# Patient Record
Sex: Male | Born: 1960 | Race: White | Hispanic: No | Marital: Married | Smoking: Current every day smoker
Health system: Southern US, Community
[De-identification: ages and names within clinical notes are randomized; demographics above are authoritative.]

## PROBLEM LIST (undated history)

## (undated) DIAGNOSIS — J939 Pneumothorax, unspecified: Secondary | ICD-10-CM

## (undated) DIAGNOSIS — N209 Urinary calculus, unspecified: Secondary | ICD-10-CM

## (undated) DIAGNOSIS — S42001A Fracture of unspecified part of right clavicle, initial encounter for closed fracture: Secondary | ICD-10-CM

## (undated) DIAGNOSIS — Z8701 Personal history of pneumonia (recurrent): Secondary | ICD-10-CM

---

## 2010-03-11 HISTORY — PX: CHEST TUBE INSERTION: SHX231

## 2020-01-06 ENCOUNTER — Emergency Department (HOSPITAL_COMMUNITY): Payer: Medicaid - Out of State

## 2020-01-06 ENCOUNTER — Observation Stay (HOSPITAL_COMMUNITY)
Admission: EM | Admit: 2020-01-06 | Discharge: 2020-01-09 | DRG: 194 | Disposition: A | Discharge status: Home / Self Care | Payer: Medicaid - Out of State | Source: Ambulatory Visit | Attending: Internal Medicine | Admitting: Internal Medicine

## 2020-01-06 ENCOUNTER — Other Ambulatory Visit: Payer: Self-pay

## 2020-01-06 ENCOUNTER — Encounter (HOSPITAL_COMMUNITY): Payer: Self-pay | Admitting: Emergency Medicine

## 2020-01-06 DIAGNOSIS — F17210 Nicotine dependence, cigarettes, uncomplicated: Secondary | ICD-10-CM | POA: Diagnosis present

## 2020-01-06 DIAGNOSIS — Z6823 Body mass index (BMI) 23.0-23.9, adult: Secondary | ICD-10-CM | POA: Diagnosis not present

## 2020-01-06 DIAGNOSIS — J189 Pneumonia, unspecified organism: Principal | ICD-10-CM | POA: Diagnosis present

## 2020-01-06 DIAGNOSIS — Z801 Family history of malignant neoplasm of trachea, bronchus and lung: Secondary | ICD-10-CM | POA: Diagnosis not present

## 2020-01-06 DIAGNOSIS — D649 Anemia, unspecified: Secondary | ICD-10-CM | POA: Diagnosis present

## 2020-01-06 DIAGNOSIS — D75839 Thrombocytosis, unspecified: Secondary | ICD-10-CM | POA: Diagnosis present

## 2020-01-06 DIAGNOSIS — R042 Hemoptysis: Secondary | ICD-10-CM | POA: Diagnosis not present

## 2020-01-06 DIAGNOSIS — Z20822 Contact with and (suspected) exposure to covid-19: Secondary | ICD-10-CM | POA: Diagnosis present

## 2020-01-06 DIAGNOSIS — D509 Iron deficiency anemia, unspecified: Secondary | ICD-10-CM | POA: Diagnosis not present

## 2020-01-06 DIAGNOSIS — J440 Chronic obstructive pulmonary disease with acute lower respiratory infection: Secondary | ICD-10-CM | POA: Diagnosis not present

## 2020-01-06 DIAGNOSIS — Z87442 Personal history of urinary calculi: Secondary | ICD-10-CM | POA: Diagnosis not present

## 2020-01-06 DIAGNOSIS — E871 Hypo-osmolality and hyponatremia: Secondary | ICD-10-CM | POA: Diagnosis not present

## 2020-01-06 DIAGNOSIS — E46 Unspecified protein-calorie malnutrition: Secondary | ICD-10-CM | POA: Diagnosis present

## 2020-01-06 HISTORY — DX: Fracture of unspecified part of right clavicle, initial encounter for closed fracture: S42.001A

## 2020-01-06 HISTORY — DX: Personal history of pneumonia (recurrent): Z87.01

## 2020-01-06 HISTORY — DX: Urinary calculus, unspecified: N20.9

## 2020-01-06 HISTORY — DX: Pneumothorax, unspecified: J93.9

## 2020-01-06 LAB — CBC WITH DIFFERENTIAL/PLATELET
Abs Immature Granulocytes: 0.08 10*3/uL — ABNORMAL HIGH (ref 0.00–0.07)
Basophils Absolute: 0.1 10*3/uL (ref 0.0–0.1)
Basophils Relative: 0 %
Eosinophils Absolute: 0.1 10*3/uL (ref 0.0–0.5)
Eosinophils Relative: 1 %
HCT: 35.9 % — ABNORMAL LOW (ref 39.0–52.0)
Hemoglobin: 11.6 g/dL — ABNORMAL LOW (ref 13.0–17.0)
Immature Granulocytes: 1 %
Lymphocytes Relative: 16 %
Lymphs Abs: 2.5 10*3/uL (ref 0.7–4.0)
MCH: 30.3 pg (ref 26.0–34.0)
MCHC: 32.3 g/dL (ref 30.0–36.0)
MCV: 93.7 fL (ref 80.0–100.0)
Monocytes Absolute: 1.4 10*3/uL — ABNORMAL HIGH (ref 0.1–1.0)
Monocytes Relative: 9 %
Neutro Abs: 11.4 10*3/uL — ABNORMAL HIGH (ref 1.7–7.7)
Neutrophils Relative %: 73 %
Platelets: 678 10*3/uL — ABNORMAL HIGH (ref 150–400)
RBC: 3.83 MIL/uL — ABNORMAL LOW (ref 4.22–5.81)
RDW: 12.2 % (ref 11.5–15.5)
WBC: 15.6 10*3/uL — ABNORMAL HIGH (ref 4.0–10.5)
nRBC: 0 % (ref 0.0–0.2)

## 2020-01-06 LAB — COMPREHENSIVE METABOLIC PANEL
ALT: 28 U/L (ref 0–44)
AST: 20 U/L (ref 15–41)
Albumin: 2.8 g/dL — ABNORMAL LOW (ref 3.5–5.0)
Alkaline Phosphatase: 157 U/L — ABNORMAL HIGH (ref 38–126)
Anion gap: 10 (ref 5–15)
BUN: 14 mg/dL (ref 8–23)
CO2: 25 mmol/L (ref 22–32)
Calcium: 8.6 mg/dL — ABNORMAL LOW (ref 8.9–10.3)
Chloride: 97 mmol/L — ABNORMAL LOW (ref 98–111)
Creatinine, Ser: 0.8 mg/dL (ref 0.61–1.24)
GFR, Estimated: >60 mL/min (ref >60)
Glucose, Bld: 124 mg/dL — ABNORMAL HIGH (ref 70–99)
Potassium: 3.8 mmol/L (ref 3.5–5.1)
Sodium: 132 mmol/L — ABNORMAL LOW (ref 135–145)
Total Bilirubin: 0.6 mg/dL (ref 0.3–1.2)
Total Protein: 7.7 g/dL (ref 6.5–8.1)

## 2020-01-06 LAB — RESPIRATORY PANEL BY RT PCR (FLU A&B, COVID)
Influenza A by PCR: NEGATIVE
Influenza B by PCR: NEGATIVE
SARS Coronavirus 2 by RT PCR: NEGATIVE

## 2020-01-06 MED ORDER — IOHEXOL 350 MG/ML SOLN
100.0000 mL | Freq: Once | INTRAVENOUS | Status: AC | PRN
  Administered 2020-01-06: 100 mL via INTRAVENOUS

## 2020-01-06 NOTE — ED Triage Notes (Signed)
Pt was [facility] from [facility] here for work up. C/o of cough, sob, weakness and hemoptysis

## 2020-01-06 NOTE — ED Provider Notes (Signed)
[facility] EMERGENCY DEPARTMENT Provider Note   CSN: [id number] Arrival date & time: 01/06/20  1714     History Chief Complaint  Patient presents with   Shortness of Breath    Francisco Pittman is a 59 y.o. male.  Presents to the emergency department for evaluation of cough and shortness of breath.  He was sent from urgent care.  Patient reports that he has been having symptoms for 2 weeks.  Patient reports severe cough.  He has had generalized weakness.  He has been running a fever on and off.  He does report that he has coughed up some bloody sputum at times.  He reports that he has had frequent recurrent pneumonias in the past.  He has not had Covid vaccination.        History reviewed. No pertinent past medical history.  There are no problems to display for this patient.   History reviewed. No pertinent surgical history.     History reviewed. No pertinent family history.  Social History   Tobacco Use   Smoking status: Current Every Day Smoker   Smokeless tobacco: Never Used  Substance Use Topics   Alcohol use: Never   Drug use: Never    Home Medications Prior to Admission medications   Not on File    Allergies    Patient has no allergy information on record.  Review of Systems   Review of Systems  Constitutional: Positive for chills, fatigue and fever.  Respiratory: Positive for cough and shortness of breath.   All other systems reviewed and are negative.   Physical Exam Updated Vital Signs BP (!) 124/56 (BP Location: Left Arm)    Pulse 86    Temp 99.3 F (37.4 C) (Oral)    Resp 17    Ht 5\' 7"  (1.702 m)    Wt 69.4 kg    SpO2 98%    BMI 23.96 kg/m   Physical Exam Vitals and nursing note reviewed.  Constitutional:      General: He is not in acute distress.    Appearance: Normal appearance. He is well-developed.  HENT:     Head: Normocephalic and atraumatic.     Right Ear: Hearing normal.     Left Ear: Hearing normal.     Nose: Nose  normal.  Eyes:     Conjunctiva/sclera: Conjunctivae normal.     Pupils: Pupils are equal, round, and reactive to light.  Cardiovascular:     Rate and Rhythm: Regular rhythm.     Heart sounds: S1 normal and S2 normal. No murmur heard.  No friction rub. No gallop.   Pulmonary:     Effort: Pulmonary effort is normal. No respiratory distress.     Breath sounds: Normal breath sounds.  Chest:     Chest wall: No tenderness.  Abdominal:     General: Bowel sounds are normal.     Palpations: Abdomen is soft.     Tenderness: There is no abdominal tenderness. There is no guarding or rebound. Negative signs include Murphy's sign and McBurney's sign.     Hernia: No hernia is present.  Musculoskeletal:        General: Normal range of motion.     Cervical back: Normal range of motion and neck supple.  Skin:    General: Skin is warm and dry.     Findings: No rash.  Neurological:     Mental Status: He is alert and oriented to person, place, and time.  GCS: GCS eye subscore is 4. GCS verbal subscore is 5. GCS motor subscore is 6.     Cranial Nerves: No cranial nerve deficit.     Sensory: No sensory deficit.     Coordination: Coordination normal.  Psychiatric:        Speech: Speech normal.        Behavior: Behavior normal.        Thought Content: Thought content normal.     ED Results / Procedures / Treatments   Labs (all labs ordered are listed, but only abnormal results are displayed) Labs Reviewed  CBC WITH DIFFERENTIAL/PLATELET - Abnormal; Notable for the following components:      Result Value   WBC 15.6 (*)    RBC 3.83 (*)    Hemoglobin 11.6 (*)    HCT 35.9 (*)    Platelets 678 (*)    Neutro Abs 11.4 (*)    Monocytes Absolute 1.4 (*)    Abs Immature Granulocytes 0.08 (*)    All other components within normal limits  COMPREHENSIVE METABOLIC PANEL - Abnormal; Notable for the following components:   Sodium 132 (*)    Chloride 97 (*)    Glucose, Bld 124 (*)    Calcium 8.6 (*)     Albumin 2.8 (*)    Alkaline Phosphatase 157 (*)    All other components within normal limits  RESPIRATORY PANEL BY RT PCR (FLU A&B, COVID)  CULTURE, BLOOD (ROUTINE X 2)  CULTURE, BLOOD (ROUTINE X 2)    EKG EKG Interpretation  Date/Time:  Thursday January 06 2020 17:30:36 EDT Ventricular Rate:  79 PR Interval:  202 QRS Duration: 92 QT Interval:  354 QTC Calculation: 405 R Axis:   78 Text Interpretation: Normal sinus rhythm Normal ECG Confirmed by Gilda Crease [phone]) on 01/06/2020 5:49:42 PM   Radiology DG Chest 2 View  Result Date: 01/06/2020 CLINICAL DATA:  Cough, short of breath weakness EXAM: CHEST - 2 VIEW COMPARISON:  None. FINDINGS: Normal cardiac silhouette. 5 cm mass in the superior aspect of the LEFT lower lobe. Potential cavitary focus along the superior margin of the mass. Ovoid mass in the RIGHT upper lobe measuring 4 cm. No pleural fluid. No pneumothorax. IMPRESSION: Bilateral pulmonary masses. Favor bilateral pneumonia. Potential cavitary component of the LEFT lung mass. Recommend CT of the thorax with contrast to exclude underlying neoplasm. Electronically Signed   By: Genevive Bi M.D.   On: 01/06/2020 18:52   CT ANGIO CHEST PE W OR WO CONTRAST  Result Date: 01/06/2020 CLINICAL DATA:  Cough shortness of breath weakness hemoptysis EXAM: CT ANGIOGRAPHY CHEST WITH CONTRAST TECHNIQUE: Multidetector CT imaging of the chest was performed using the standard protocol during bolus administration of intravenous contrast. Multiplanar CT image reconstructions and MIPs were obtained to evaluate the vascular anatomy. CONTRAST:  OMNIPAQUE IOHEXOL 350 MG/ML SOLN COMPARISON:  None. FINDINGS: Cardiovascular: There is a optimal opacification of the pulmonary arteries. There is no central,segmental, or subsegmental filling defects within the pulmonary arteries. There is mild cardiomegaly. No pericardial effusion or thickening. No evidence right heart strain. There is  normal three-vessel brachiocephalic anatomy without proximal stenosis. The thoracic aorta is normal in appearance. Mediastinum/Nodes: No hilar, mediastinal, or axillary adenopathy. Thyroid gland, trachea, and esophagus demonstrate no significant findings. Lungs/Pleura: There is multifocal rounded ill-defined areas of airspace consolidation seen throughout both lungs. The largest within the posterior left lung base and periphery of the right upper lung have central areas of cystic lucency/necrosis. No pleural  effusion or pneumothorax is seen. Upper Abdomen: No acute abnormalities present in the visualized portions of the upper abdomen. Musculoskeletal: No chest wall abnormality. No acute or significant osseous findings. Review of the MIP images confirms the above findings. IMPRESSION: No central, segmental, or subsegmental pulmonary embolism. Multifocal rounded airspace consolidations seen throughout both lungs some with central areas of central lucency/necrosis. This could be due to septic emboli or multifocal pneumonia. Followup PA and lateral chest X-ray is recommended in 3-4 weeks following trial of antibiotic therapy to ensure resolution and exclude underlying malignancy. Electronically Signed   By: Jonna Clark M.D.   On: 01/06/2020 22:29    Procedures Procedures (including critical care time)  Medications Ordered in ED Medications  iohexol (OMNIPAQUE) 350 MG/ML injection 100 mL (100 mLs Intravenous Contrast Given 01/06/20 2214)    ED Course  I have reviewed the triage vital signs and the nursing notes.  Pertinent labs & imaging results that were available during my care of the patient were reviewed by me and considered in my medical decision making (see chart for details).    MDM Rules/Calculators/A&P                          Patient presents to the emergency department from urgent care.  Patient reports he has had a 2-week history of cough and chest congestion.  Cough has progressively  worsened.  He is having some pain in the chest now.  He has had fever over this period of time.  X-ray shows multifocal pneumonia with some cavitary lesions, CT scan recommended.  CT scan shows no evidence of PE, confirms multifocal pneumonia.  Imaging concerning for possible septic emboli.  Will require hospitalization for further work-up.  Final Clinical Impression(s) / ED Diagnoses Final diagnoses:  Multifocal pneumonia    Rx / DC Orders ED Discharge Orders    None       Gilda Crease, MD 01/06/20 2336

## 2020-01-07 ENCOUNTER — Encounter (HOSPITAL_COMMUNITY): Payer: Self-pay | Admitting: Internal Medicine

## 2020-01-07 DIAGNOSIS — E46 Unspecified protein-calorie malnutrition: Secondary | ICD-10-CM | POA: Diagnosis present

## 2020-01-07 DIAGNOSIS — D75839 Thrombocytosis, unspecified: Secondary | ICD-10-CM | POA: Diagnosis present

## 2020-01-07 DIAGNOSIS — D649 Anemia, unspecified: Secondary | ICD-10-CM | POA: Diagnosis present

## 2020-01-07 DIAGNOSIS — J189 Pneumonia, unspecified organism: Secondary | ICD-10-CM | POA: Diagnosis not present

## 2020-01-07 DIAGNOSIS — E871 Hypo-osmolality and hyponatremia: Secondary | ICD-10-CM | POA: Diagnosis present

## 2020-01-07 LAB — HIV ANTIBODY (ROUTINE TESTING W REFLEX): HIV Screen 4th Generation wRfx: NONREACTIVE

## 2020-01-07 LAB — BASIC METABOLIC PANEL
Anion gap: 9 (ref 5–15)
BUN: 13 mg/dL (ref 8–23)
CO2: 23 mmol/L (ref 22–32)
Calcium: 8.6 mg/dL — ABNORMAL LOW (ref 8.9–10.3)
Chloride: 101 mmol/L (ref 98–111)
Creatinine, Ser: 0.63 mg/dL (ref 0.61–1.24)
GFR, Estimated: >60 mL/min (ref >60)
Glucose, Bld: 130 mg/dL — ABNORMAL HIGH (ref 70–99)
Potassium: 4.4 mmol/L (ref 3.5–5.1)
Sodium: 133 mmol/L — ABNORMAL LOW (ref 135–145)

## 2020-01-07 LAB — CBC WITH DIFFERENTIAL/PLATELET
Abs Immature Granulocytes: 0.06 10*3/uL (ref 0.00–0.07)
Basophils Absolute: 0 10*3/uL (ref 0.0–0.1)
Basophils Relative: 0 %
Eosinophils Absolute: 0 10*3/uL (ref 0.0–0.5)
Eosinophils Relative: 0 %
HCT: 33.4 % — ABNORMAL LOW (ref 39.0–52.0)
Hemoglobin: 10.9 g/dL — ABNORMAL LOW (ref 13.0–17.0)
Immature Granulocytes: 1 %
Lymphocytes Relative: 7 %
Lymphs Abs: 0.9 10*3/uL (ref 0.7–4.0)
MCH: 30.4 pg (ref 26.0–34.0)
MCHC: 32.6 g/dL (ref 30.0–36.0)
MCV: 93.3 fL (ref 80.0–100.0)
Monocytes Absolute: 0.3 10*3/uL (ref 0.1–1.0)
Monocytes Relative: 2 %
Neutro Abs: 12.1 10*3/uL — ABNORMAL HIGH (ref 1.7–7.7)
Neutrophils Relative %: 90 %
Platelets: 592 10*3/uL — ABNORMAL HIGH (ref 150–400)
RBC: 3.58 MIL/uL — ABNORMAL LOW (ref 4.22–5.81)
RDW: 12.2 % (ref 11.5–15.5)
WBC: 13.3 10*3/uL — ABNORMAL HIGH (ref 4.0–10.5)
nRBC: 0 % (ref 0.0–0.2)

## 2020-01-07 LAB — RETICULOCYTES
Immature Retic Fract: 9.2 % (ref 2.3–15.9)
RBC.: 3.62 MIL/uL — ABNORMAL LOW (ref 4.22–5.81)
Retic Count, Absolute: 37.6 10*3/uL (ref 19.0–186.0)
Retic Ct Pct: 1 % (ref 0.4–3.1)

## 2020-01-07 LAB — IRON AND TIBC
Iron: 24 ug/dL — ABNORMAL LOW (ref 45–182)
Saturation Ratios: 12 % — ABNORMAL LOW (ref 17.9–39.5)
TIBC: 204 ug/dL — ABNORMAL LOW (ref 250–450)
UIBC: 180 ug/dL

## 2020-01-07 LAB — MRSA PCR SCREENING: MRSA by PCR: NEGATIVE

## 2020-01-07 LAB — FOLATE: Folate: 11.3 ng/mL (ref >5.9)

## 2020-01-07 LAB — FERRITIN: Ferritin: 726 ng/mL — ABNORMAL HIGH (ref 24–336)

## 2020-01-07 LAB — VITAMIN B12: Vitamin B-12: 262 pg/mL (ref 180–914)

## 2020-01-07 MED ORDER — SODIUM CHLORIDE 0.9 % IV SOLN
2.0000 g | Freq: Once | INTRAVENOUS | Status: AC
  Administered 2020-01-07: 2 g via INTRAVENOUS
  Filled 2020-01-07: qty 2

## 2020-01-07 MED ORDER — VANCOMYCIN HCL 750 MG/150ML IV SOLN
750.0000 mg | Freq: Three times a day (TID) | INTRAVENOUS | Status: DC
  Administered 2020-01-07 – 2020-01-08 (×4): 750 mg via INTRAVENOUS
  Filled 2020-01-07 (×12): qty 150

## 2020-01-07 MED ORDER — DM-GUAIFENESIN ER 30-600 MG PO TB12
1.0000 | ORAL_TABLET | Freq: Two times a day (BID) | ORAL | Status: DC
  Administered 2020-01-07 – 2020-01-09 (×6): 1 via ORAL
  Filled 2020-01-07 (×6): qty 1

## 2020-01-07 MED ORDER — IPRATROPIUM-ALBUTEROL 0.5-2.5 (3) MG/3ML IN SOLN
3.0000 mL | Freq: Four times a day (QID) | RESPIRATORY_TRACT | Status: DC | PRN
  Administered 2020-01-07: 3 mL via RESPIRATORY_TRACT
  Filled 2020-01-07: qty 3

## 2020-01-07 MED ORDER — ONDANSETRON HCL 4 MG PO TABS: 4.0000 mg | ORAL_TABLET | Freq: Four times a day (QID) | ORAL | Status: DC | PRN

## 2020-01-07 MED ORDER — ONDANSETRON HCL 4 MG/2ML IJ SOLN: 4.0000 mg | Freq: Four times a day (QID) | INTRAMUSCULAR | Status: DC | PRN

## 2020-01-07 MED ORDER — ACETAMINOPHEN 325 MG PO TABS: 650.0000 mg | ORAL_TABLET | Freq: Four times a day (QID) | ORAL | Status: DC | PRN

## 2020-01-07 MED ORDER — ACETAMINOPHEN 650 MG RE SUPP: 650.0000 mg | Freq: Four times a day (QID) | RECTAL | Status: DC | PRN

## 2020-01-07 MED ORDER — SODIUM CHLORIDE 0.9 % IV SOLN: INTRAVENOUS | Status: DC

## 2020-01-07 MED ORDER — METHYLPREDNISOLONE SODIUM SUCC 40 MG IJ SOLR
40.0000 mg | Freq: Once | INTRAMUSCULAR | Status: AC
  Administered 2020-01-07: 40 mg via INTRAVENOUS
  Filled 2020-01-07: qty 1

## 2020-01-07 MED ORDER — VANCOMYCIN HCL 1250 MG/250ML IV SOLN
1250.0000 mg | Freq: Once | INTRAVENOUS | Status: AC
  Administered 2020-01-07: 1250 mg via INTRAVENOUS
  Filled 2020-01-07: qty 250

## 2020-01-07 MED ORDER — SODIUM CHLORIDE 0.9 % IV SOLN
2.0000 g | Freq: Three times a day (TID) | INTRAVENOUS | Status: DC
  Administered 2020-01-07 – 2020-01-09 (×7): 2 g via INTRAVENOUS
  Filled 2020-01-07 (×7): qty 2
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
## 2020-01-07 NOTE — Progress Notes (Signed)
Pharmacy Antibiotic Note  Francisco Pittman is a 59 y.o. male admitted on 01/06/2020 with pneumonia.  Pharmacy has been consulted for vancomycin and cefepim dosing.  Plan: Vancomycin 1250mg  x1 then 750mg  IV every 8 hours.  Goal trough 15-20 mcg/mL.  Cefepime 2g IV every 8 hours.  Height: 5\' 7"  (170.2 cm) Weight: 69.4 kg (153 lb) IBW/kg (Calculated) : 66.1  Temp (24hrs), Avg:99.3 F (37.4 C), Min:99.3 F (37.4 C), Max:99.3 F (37.4 C)  Recent Labs  Lab 01/06/20 1809  WBC 15.6*  CREATININE 0.80    Estimated Creatinine Clearance: 90.7 mL/min (by C-G formula based on SCr of 0.8 mg/dL).     Thank you for allowing pharmacy to be a part of this patient's care.  , PharmD, BCPS  01/07/2020 1:37 AM

## 2020-01-08 DIAGNOSIS — D75839 Thrombocytosis, unspecified: Secondary | ICD-10-CM | POA: Diagnosis not present

## 2020-01-08 DIAGNOSIS — E871 Hypo-osmolality and hyponatremia: Secondary | ICD-10-CM | POA: Diagnosis not present

## 2020-01-08 DIAGNOSIS — J189 Pneumonia, unspecified organism: Secondary | ICD-10-CM | POA: Diagnosis not present

## 2020-01-08 DIAGNOSIS — D649 Anemia, unspecified: Secondary | ICD-10-CM

## 2020-01-08 LAB — BASIC METABOLIC PANEL
Anion gap: 8 (ref 5–15)
BUN: 15 mg/dL (ref 8–23)
CO2: 22 mmol/L (ref 22–32)
Calcium: 8.4 mg/dL — ABNORMAL LOW (ref 8.9–10.3)
Chloride: 105 mmol/L (ref 98–111)
Creatinine, Ser: 0.6 mg/dL — ABNORMAL LOW (ref 0.61–1.24)
GFR, Estimated: >60 mL/min (ref >60)
Glucose, Bld: 100 mg/dL — ABNORMAL HIGH (ref 70–99)
Potassium: 4.3 mmol/L (ref 3.5–5.1)
Sodium: 135 mmol/L (ref 135–145)

## 2020-01-08 LAB — CBC WITH DIFFERENTIAL/PLATELET
Abs Immature Granulocytes: 0.04 10*3/uL (ref 0.00–0.07)
Basophils Absolute: 0 10*3/uL (ref 0.0–0.1)
Basophils Relative: 0 %
Eosinophils Absolute: 0 10*3/uL (ref 0.0–0.5)
Eosinophils Relative: 0 %
HCT: 33.9 % — ABNORMAL LOW (ref 39.0–52.0)
Hemoglobin: 10.9 g/dL — ABNORMAL LOW (ref 13.0–17.0)
Immature Granulocytes: 0 %
Lymphocytes Relative: 23 %
Lymphs Abs: 2.8 10*3/uL (ref 0.7–4.0)
MCH: 30.5 pg (ref 26.0–34.0)
MCHC: 32.2 g/dL (ref 30.0–36.0)
MCV: 95 fL (ref 80.0–100.0)
Monocytes Absolute: 1 10*3/uL (ref 0.1–1.0)
Monocytes Relative: 8 %
Neutro Abs: 8.4 10*3/uL — ABNORMAL HIGH (ref 1.7–7.7)
Neutrophils Relative %: 69 %
Platelets: 648 10*3/uL — ABNORMAL HIGH (ref 150–400)
RBC: 3.57 MIL/uL — ABNORMAL LOW (ref 4.22–5.81)
RDW: 12.2 % (ref 11.5–15.5)
WBC: 12.3 10*3/uL — ABNORMAL HIGH (ref 4.0–10.5)
nRBC: 0 % (ref 0.0–0.2)

## 2020-01-08 LAB — VANCOMYCIN, TROUGH: Vancomycin Tr: 15 ug/mL (ref 15–20)
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
## 2020-01-09 ENCOUNTER — Other Ambulatory Visit: Payer: Self-pay | Admitting: Internal Medicine

## 2020-01-09 DIAGNOSIS — J189 Pneumonia, unspecified organism: Secondary | ICD-10-CM | POA: Diagnosis not present

## 2020-01-09 DIAGNOSIS — E871 Hypo-osmolality and hyponatremia: Secondary | ICD-10-CM | POA: Diagnosis not present

## 2020-01-09 DIAGNOSIS — J449 Chronic obstructive pulmonary disease, unspecified: Secondary | ICD-10-CM

## 2020-01-09 DIAGNOSIS — D649 Anemia, unspecified: Secondary | ICD-10-CM | POA: Diagnosis not present

## 2020-01-09 LAB — BASIC METABOLIC PANEL
Anion gap: 7 (ref 5–15)
BUN: 11 mg/dL (ref 8–23)
CO2: 23 mmol/L (ref 22–32)
Calcium: 8.2 mg/dL — ABNORMAL LOW (ref 8.9–10.3)
Chloride: 105 mmol/L (ref 98–111)
Creatinine, Ser: 0.63 mg/dL (ref 0.61–1.24)
GFR, Estimated: >60 mL/min (ref >60)
Glucose, Bld: 132 mg/dL — ABNORMAL HIGH (ref 70–99)
Potassium: 3.5 mmol/L (ref 3.5–5.1)
Sodium: 135 mmol/L (ref 135–145)

## 2020-01-09 LAB — CBC WITH DIFFERENTIAL/PLATELET
Abs Immature Granulocytes: 0.04 10*3/uL (ref 0.00–0.07)
Basophils Absolute: 0.1 10*3/uL (ref 0.0–0.1)
Basophils Relative: 1 %
Eosinophils Absolute: 0.1 10*3/uL (ref 0.0–0.5)
Eosinophils Relative: 1 %
HCT: 31.6 % — ABNORMAL LOW (ref 39.0–52.0)
Hemoglobin: 10.2 g/dL — ABNORMAL LOW (ref 13.0–17.0)
Immature Granulocytes: 0 %
Lymphocytes Relative: 28 %
Lymphs Abs: 2.6 10*3/uL (ref 0.7–4.0)
MCH: 30.4 pg (ref 26.0–34.0)
MCHC: 32.3 g/dL (ref 30.0–36.0)
MCV: 94.3 fL (ref 80.0–100.0)
Monocytes Absolute: 0.8 10*3/uL (ref 0.1–1.0)
Monocytes Relative: 8 %
Neutro Abs: 5.8 10*3/uL (ref 1.7–7.7)
Neutrophils Relative %: 62 %
Platelets: 597 10*3/uL — ABNORMAL HIGH (ref 150–400)
RBC: 3.35 MIL/uL — ABNORMAL LOW (ref 4.22–5.81)
RDW: 12.3 % (ref 11.5–15.5)
WBC: 9.4 10*3/uL (ref 4.0–10.5)
nRBC: 0 % (ref 0.0–0.2)

## 2020-01-09 MED ORDER — NICOTINE 21 MG/24HR TD PT24: 21.0000 mg | MEDICATED_PATCH | TRANSDERMAL | 1 refills | Status: DC

## 2020-01-09 MED ORDER — ALBUTEROL SULFATE HFA 108 (90 BASE) MCG/ACT IN AERS: 2.0000 | INHALATION_SPRAY | Freq: Four times a day (QID) | RESPIRATORY_TRACT | 2 refills | Status: AC | PRN

## 2020-01-09 MED ORDER — GUAIFENESIN ER 600 MG PO TB12: 600.0000 mg | ORAL_TABLET | Freq: Two times a day (BID) | ORAL | 2 refills | Status: DC

## 2020-01-09 MED ORDER — AMOXICILLIN-POT CLAVULANATE 875-125 MG PO TABS: 1.0000 | ORAL_TABLET | Freq: Two times a day (BID) | ORAL | 0 refills | Status: AC

## 2020-01-09 NOTE — Plan of Care (Signed)
  Problem: Education: Goal: Knowledge of General Education information will improve Description Including pain rating scale, medication(s)/side effects and non-pharmacologic comfort measures Outcome: Progressing   Problem: Health Behavior/Discharge Planning: Goal: Ability to manage health-related needs will improve Outcome: Progressing   
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
## 2020-01-09 NOTE — Discharge Instructions (Signed)
Community-Acquired Pneumonia, Adult Pneumonia is an infection of the lungs. It causes swelling in the airways of the lungs. Mucus and fluid may also build up inside the airways. One type of pneumonia can happen while a person is in a hospital. A different type can happen when a person is not in a hospital (community-acquired pneumonia).  What are the causes?  This condition is caused by germs (viruses, bacteria, or fungi). Some types of germs can be passed from one person to another. This can happen when you breathe in droplets from the cough or sneeze of an infected person. What increases the risk? You are more likely to develop this condition if you:  Have a long-term (chronic) disease, such as: ? Chronic obstructive pulmonary disease (COPD). ? Asthma. ? Cystic fibrosis. ? Congestive heart failure. ? Diabetes. ? Kidney disease.  Have HIV.  Have sickle cell disease.  Have had your spleen removed.  Do not take good care of your teeth and mouth (poor dental hygiene).  Have a medical condition that increases the risk of breathing in droplets from your own mouth and nose.  Have a weakened body defense system (immune system).  Are a smoker.  Travel to areas where the germs that cause this illness are common.  Are around certain animals or the places they live. What are the signs or symptoms?  A dry cough.  A wet (productive) cough.  Fever.  Sweating.  Chest pain. This often happens when breathing deeply or coughing.  Fast breathing or trouble breathing.  Shortness of breath.  Shaking chills.  Feeling tired (fatigue).  Muscle aches. How is this treated? Treatment for this condition depends on many things. Most adults can be treated at home. In some cases, treatment must happen in a hospital. Treatment may include:  Medicines given by mouth or through an IV tube.  Being given extra oxygen.  Respiratory therapy. In rare cases, treatment for very bad pneumonia  may include:  Using a machine to help you breathe.  Having a procedure to remove fluid from around your lungs. Follow these instructions at home: Medicines  Take over-the-counter and prescription medicines only as told by your doctor. ? Only take cough medicine if you are losing sleep.  If you were prescribed an antibiotic medicine, take it as told by your doctor. Do not stop taking the antibiotic even if you start to feel better. General instructions   Sleep with your head and neck raised (elevated). You can do this by sleeping in a recliner or by putting a few pillows under your head.  Rest as needed. Get at least 8 hours of sleep each night.  Drink enough water to keep your pee (urine) pale yellow.  Eat a healthy diet that includes plenty of vegetables, fruits, whole grains, low-fat dairy products, and lean protein.  Do not use any products that contain nicotine or tobacco. These include cigarettes, e-cigarettes, and chewing tobacco. If you need help quitting, ask your doctor.  Keep all follow-up visits as told by your doctor. This is important. How is this prevented? A shot (vaccine) can help prevent pneumonia. Shots are often suggested for:  People older than 61 years of age.  People older than 61 years of age who: ? Are having cancer treatment. ? Have long-term (chronic) lung disease. ? Have problems with their body's defense system. You may also prevent pneumonia if you take these actions:  Get the flu (influenza) shot every year.  Go to the dentist as   often as told.  Wash your hands often. If you cannot use soap and water, use hand sanitizer. Contact a doctor if:  You have a fever.  You lose sleep because your cough medicine does not help. Get help right away if:  You are short of breath and it gets worse.  You have more chest pain.  Your sickness gets worse. This is very serious if: ? You are an older adult. ? Your body's defense system is weak.  You  cough up blood. Summary  Pneumonia is an infection of the lungs.  Most adults can be treated at home. Some will need treatment in a hospital.  Drink enough water to keep your pee pale yellow.  Get at least 8 hours of sleep each night. This information is not intended to replace advice given to you by your health care provider. Make sure you discuss any questions you have with your health care provider. Document Revised: 06/17/2018 Document Reviewed: 10/23/2017 Elsevier Patient Education  2020 Else[employer] Inc[employer]  

## 2020-01-09 NOTE — Progress Notes (Signed)
Nsg Discharge Note  Admit Date:  01/06/2020 Discharge date: 01/09/2020   Dewane Timson to be D/C'd Home  per MD order.  AVS completed.  Patient able to verbalize understanding.  Discharge Medication: Allergies as of 01/09/2020   Not on File     Medication List    TAKE these medications   albuterol 108 (90 Base) MCG/ACT inhaler Commonly known as: VENTOLIN HFA Inhale 2 puffs into the lungs every 6 (six) hours as needed for wheezing or shortness of breath.   amoxicillin-clavulanate 875-125 MG tablet Commonly known as: Augmentin Take 1 tablet by mouth 2 (two) times daily for 7 days.   guaiFENesin 600 MG 12 hr tablet Commonly known as: Mucinex Take 1 tablet (600 mg total) by mouth 2 (two) times daily.   nicotine 21 mg/24hr patch Commonly known as: NICODERM CQ - dosed in mg/24 hours Place 1 patch (21 mg total) onto the skin daily.       Discharge Assessment: Vitals:   01/08/20 2218 01/09/20 0611  BP: 126/60 (!) 109/56  Pulse: 61 (!) 58  Resp:  18  Temp: 98.7 F (37.1 C) 97.8 F (36.6 C)  SpO2: 98% 98%   Skin clean, dry and intact without evidence of skin break down, no evidence of skin tears noted. IV catheter discontinued intact. Site without signs and symptoms of complications - no redness or edema noted at insertion site, patient denies c/o pain - only slight tenderness at site.  Dressing with slight pressure applied.  D/c Instructions-Education: Discharge instructions given to patient with verbalized understanding. D/c education completed with patient including follow up instructions, medication list, d/c activities limitations if indicated, with other d/c instructions as indicated by MD - patient able to verbalize understanding, all questions fully answered. Patient instructed to return to ED, call 911, or call MD for any changes in condition.  Patient escorted via WC, and D/C home via private auto.  Rohith Fauth Salena Saner, RN 01/09/2020 2:14 PM

## 2020-01-12 LAB — CULTURE, BLOOD (ROUTINE X 2)
Culture: NO GROWTH
Culture: NO GROWTH
Special Requests: ADEQUATE
Special Requests: ADEQUATE

## 2020-02-02 ENCOUNTER — Emergency Department (HOSPITAL_COMMUNITY): Payer: Medicaid - Out of State

## 2020-02-02 ENCOUNTER — Inpatient Hospital Stay (HOSPITAL_COMMUNITY)
Admission: EM | Admit: 2020-02-02 | Discharge: 2020-02-16 | DRG: 853 | Disposition: A | Discharge status: Home / Self Care | Payer: Medicaid - Out of State | Attending: Internal Medicine | Admitting: Internal Medicine

## 2020-02-02 ENCOUNTER — Inpatient Hospital Stay (HOSPITAL_COMMUNITY): Payer: Medicaid - Out of State

## 2020-02-02 ENCOUNTER — Other Ambulatory Visit: Payer: Self-pay

## 2020-02-02 ENCOUNTER — Encounter (HOSPITAL_COMMUNITY): Payer: Self-pay | Admitting: Emergency Medicine

## 2020-02-02 DIAGNOSIS — J189 Pneumonia, unspecified organism: Secondary | ICD-10-CM | POA: Diagnosis not present

## 2020-02-02 DIAGNOSIS — R0600 Dyspnea, unspecified: Secondary | ICD-10-CM

## 2020-02-02 DIAGNOSIS — K029 Dental caries, unspecified: Secondary | ICD-10-CM | POA: Diagnosis present

## 2020-02-02 DIAGNOSIS — K5900 Constipation, unspecified: Secondary | ICD-10-CM | POA: Diagnosis present

## 2020-02-02 DIAGNOSIS — E871 Hypo-osmolality and hyponatremia: Secondary | ICD-10-CM | POA: Diagnosis present

## 2020-02-02 DIAGNOSIS — D638 Anemia in other chronic diseases classified elsewhere: Secondary | ICD-10-CM | POA: Diagnosis present

## 2020-02-02 DIAGNOSIS — Y95 Nosocomial condition: Secondary | ICD-10-CM | POA: Diagnosis present

## 2020-02-02 DIAGNOSIS — J918 Pleural effusion in other conditions classified elsewhere: Secondary | ICD-10-CM | POA: Diagnosis present

## 2020-02-02 DIAGNOSIS — A419 Sepsis, unspecified organism: Principal | ICD-10-CM | POA: Diagnosis present

## 2020-02-02 DIAGNOSIS — J869 Pyothorax without fistula: Secondary | ICD-10-CM | POA: Diagnosis present

## 2020-02-02 DIAGNOSIS — D509 Iron deficiency anemia, unspecified: Secondary | ICD-10-CM | POA: Diagnosis present

## 2020-02-02 DIAGNOSIS — J9811 Atelectasis: Secondary | ICD-10-CM | POA: Diagnosis present

## 2020-02-02 DIAGNOSIS — J449 Chronic obstructive pulmonary disease, unspecified: Secondary | ICD-10-CM | POA: Diagnosis present

## 2020-02-02 DIAGNOSIS — Z9689 Presence of other specified functional implants: Secondary | ICD-10-CM

## 2020-02-02 DIAGNOSIS — Z720 Tobacco use: Secondary | ICD-10-CM | POA: Diagnosis present

## 2020-02-02 DIAGNOSIS — B954 Other streptococcus as the cause of diseases classified elsewhere: Secondary | ICD-10-CM | POA: Diagnosis present

## 2020-02-02 DIAGNOSIS — Z20822 Contact with and (suspected) exposure to covid-19: Secondary | ICD-10-CM | POA: Diagnosis present

## 2020-02-02 DIAGNOSIS — E46 Unspecified protein-calorie malnutrition: Secondary | ICD-10-CM | POA: Diagnosis present

## 2020-02-02 DIAGNOSIS — J90 Pleural effusion, not elsewhere classified: Secondary | ICD-10-CM

## 2020-02-02 DIAGNOSIS — Z716 Tobacco abuse counseling: Secondary | ICD-10-CM

## 2020-02-02 DIAGNOSIS — L89321 Pressure ulcer of left buttock, stage 1: Secondary | ICD-10-CM | POA: Diagnosis not present

## 2020-02-02 DIAGNOSIS — F1010 Alcohol abuse, uncomplicated: Secondary | ICD-10-CM | POA: Diagnosis not present

## 2020-02-02 DIAGNOSIS — Z8701 Personal history of pneumonia (recurrent): Secondary | ICD-10-CM | POA: Diagnosis not present

## 2020-02-02 DIAGNOSIS — Z801 Family history of malignant neoplasm of trachea, bronchus and lung: Secondary | ICD-10-CM

## 2020-02-02 DIAGNOSIS — R6520 Severe sepsis without septic shock: Secondary | ICD-10-CM

## 2020-02-02 DIAGNOSIS — E876 Hypokalemia: Secondary | ICD-10-CM | POA: Diagnosis present

## 2020-02-02 DIAGNOSIS — Z9889 Other specified postprocedural states: Secondary | ICD-10-CM | POA: Diagnosis not present

## 2020-02-02 DIAGNOSIS — Z79899 Other long term (current) drug therapy: Secondary | ICD-10-CM | POA: Diagnosis not present

## 2020-02-02 DIAGNOSIS — F17210 Nicotine dependence, cigarettes, uncomplicated: Secondary | ICD-10-CM | POA: Diagnosis present

## 2020-02-02 DIAGNOSIS — J440 Chronic obstructive pulmonary disease with acute lower respiratory infection: Secondary | ICD-10-CM | POA: Diagnosis present

## 2020-02-02 DIAGNOSIS — I371 Nonrheumatic pulmonary valve insufficiency: Secondary | ICD-10-CM | POA: Diagnosis not present

## 2020-02-02 DIAGNOSIS — R0602 Shortness of breath: Secondary | ICD-10-CM | POA: Diagnosis not present

## 2020-02-02 DIAGNOSIS — L8990 Pressure ulcer of unspecified site, unspecified stage: Secondary | ICD-10-CM | POA: Diagnosis present

## 2020-02-02 DIAGNOSIS — Z09 Encounter for follow-up examination after completed treatment for conditions other than malignant neoplasm: Secondary | ICD-10-CM

## 2020-02-02 DIAGNOSIS — D649 Anemia, unspecified: Secondary | ICD-10-CM | POA: Diagnosis present

## 2020-02-02 DIAGNOSIS — J939 Pneumothorax, unspecified: Secondary | ICD-10-CM

## 2020-02-02 DIAGNOSIS — K089 Disorder of teeth and supporting structures, unspecified: Secondary | ICD-10-CM | POA: Diagnosis not present

## 2020-02-02 DIAGNOSIS — J188 Other pneumonia, unspecified organism: Secondary | ICD-10-CM | POA: Diagnosis present

## 2020-02-02 LAB — CBC WITH DIFFERENTIAL/PLATELET
Abs Immature Granulocytes: 0.22 10*3/uL — ABNORMAL HIGH (ref 0.00–0.07)
Basophils Absolute: 0.1 10*3/uL (ref 0.0–0.1)
Basophils Relative: 0 %
Eosinophils Absolute: 0 10*3/uL (ref 0.0–0.5)
Eosinophils Relative: 0 %
HCT: 32.9 % — ABNORMAL LOW (ref 39.0–52.0)
Hemoglobin: 10.5 g/dL — ABNORMAL LOW (ref 13.0–17.0)
Immature Granulocytes: 1 %
Lymphocytes Relative: 5 %
Lymphs Abs: 1.3 10*3/uL (ref 0.7–4.0)
MCH: 29.1 pg (ref 26.0–34.0)
MCHC: 31.9 g/dL (ref 30.0–36.0)
MCV: 91.1 fL (ref 80.0–100.0)
Monocytes Absolute: 2.6 10*3/uL — ABNORMAL HIGH (ref 0.1–1.0)
Monocytes Relative: 9 %
Neutro Abs: 23.8 10*3/uL — ABNORMAL HIGH (ref 1.7–7.7)
Neutrophils Relative %: 85 %
Platelets: 500 10*3/uL — ABNORMAL HIGH (ref 150–400)
RBC: 3.61 MIL/uL — ABNORMAL LOW (ref 4.22–5.81)
RDW: 13.4 % (ref 11.5–15.5)
WBC: 28 10*3/uL — ABNORMAL HIGH (ref 4.0–10.5)
nRBC: 0 % (ref 0.0–0.2)

## 2020-02-02 LAB — URINALYSIS, ROUTINE W REFLEX MICROSCOPIC
Bilirubin Urine: NEGATIVE
Glucose, UA: NEGATIVE mg/dL
Hgb urine dipstick: NEGATIVE
Ketones, ur: 5 mg/dL — AB
Leukocytes,Ua: NEGATIVE
Nitrite: NEGATIVE
Protein, ur: 30 mg/dL — AB
Specific Gravity, Urine: 1.02 (ref 1.005–1.030)
pH: 5 (ref 5.0–8.0)

## 2020-02-02 LAB — BODY FLUID CELL COUNT WITH DIFFERENTIAL
Lymphs, Fluid: 35 %
Monocyte-Macrophage-Serous Fluid: 42 % — ABNORMAL LOW (ref 50–90)
Neutrophil Count, Fluid: 23 % (ref 0–25)
Total Nucleated Cell Count, Fluid: 49513 cu mm — ABNORMAL HIGH (ref 0–1000)

## 2020-02-02 LAB — PROTEIN, PLEURAL OR PERITONEAL FLUID: Total protein, fluid: <3 g/dL

## 2020-02-02 LAB — COMPREHENSIVE METABOLIC PANEL
ALT: 17 U/L (ref 0–44)
AST: 28 U/L (ref 15–41)
Albumin: 2.6 g/dL — ABNORMAL LOW (ref 3.5–5.0)
Alkaline Phosphatase: 140 U/L — ABNORMAL HIGH (ref 38–126)
Anion gap: 11 (ref 5–15)
BUN: 29 mg/dL — ABNORMAL HIGH (ref 8–23)
CO2: 23 mmol/L (ref 22–32)
Calcium: 8.6 mg/dL — ABNORMAL LOW (ref 8.9–10.3)
Chloride: 102 mmol/L (ref 98–111)
Creatinine, Ser: 1.11 mg/dL (ref 0.61–1.24)
GFR, Estimated: >60 mL/min (ref >60)
Glucose, Bld: 117 mg/dL — ABNORMAL HIGH (ref 70–99)
Potassium: 3.6 mmol/L (ref 3.5–5.1)
Sodium: 136 mmol/L (ref 135–145)
Total Bilirubin: 1 mg/dL (ref 0.3–1.2)
Total Protein: 7.4 g/dL (ref 6.5–8.1)

## 2020-02-02 LAB — RESP PANEL BY RT-PCR (FLU A&B, COVID) ARPGX2
Influenza A by PCR: NEGATIVE
Influenza B by PCR: NEGATIVE
SARS Coronavirus 2 by RT PCR: NEGATIVE

## 2020-02-02 LAB — GRAM STAIN

## 2020-02-02 LAB — ALBUMIN, PLEURAL OR PERITONEAL FLUID: Albumin, Fluid: 1.9 g/dL

## 2020-02-02 LAB — PROTIME-INR
INR: 1.3 — ABNORMAL HIGH (ref 0.8–1.2)
Prothrombin Time: 15.9 seconds — ABNORMAL HIGH (ref 11.4–15.2)

## 2020-02-02 LAB — GLUCOSE, PLEURAL OR PERITONEAL FLUID: Glucose, Fluid: <20 mg/dL

## 2020-02-02 LAB — AMYLASE, PLEURAL OR PERITONEAL FLUID: Amylase, Fluid: <5 U/L

## 2020-02-02 LAB — PROTEIN, TOTAL: Total Protein: 6.2 g/dL — ABNORMAL LOW (ref 6.5–8.1)

## 2020-02-02 LAB — LACTATE DEHYDROGENASE: LDH: 95 U/L — ABNORMAL LOW (ref 98–192)

## 2020-02-02 LAB — LACTATE DEHYDROGENASE, PLEURAL OR PERITONEAL FLUID: LD, Fluid: 5304 U/L — ABNORMAL HIGH (ref 3–23)

## 2020-02-02 LAB — LACTIC ACID, PLASMA
Lactic Acid, Venous: 1.2 mmol/L (ref 0.5–1.9)
Lactic Acid, Venous: 1 mmol/L (ref 0.5–1.9)

## 2020-02-02 LAB — ALBUMIN: Albumin: 2 g/dL — ABNORMAL LOW (ref 3.5–5.0)

## 2020-02-02 LAB — APTT: aPTT: 36 seconds (ref 24–36)

## 2020-02-02 MED ORDER — VANCOMYCIN HCL 1500 MG/300ML IV SOLN
1500.0000 mg | Freq: Once | INTRAVENOUS | Status: AC
  Administered 2020-02-02: 1500 mg via INTRAVENOUS
  Filled 2020-02-02: qty 300

## 2020-02-02 MED ORDER — ONDANSETRON HCL 4 MG PO TABS: 4.0000 mg | ORAL_TABLET | Freq: Four times a day (QID) | ORAL | Status: DC | PRN

## 2020-02-02 MED ORDER — ONDANSETRON HCL 4 MG/2ML IJ SOLN
4.0000 mg | Freq: Four times a day (QID) | INTRAMUSCULAR | Status: DC | PRN
  Administered 2020-02-04: 4 mg via INTRAVENOUS
  Filled 2020-02-02: qty 2

## 2020-02-02 MED ORDER — SODIUM CHLORIDE 0.9% FLUSH: 3.0000 mL | INTRAVENOUS | Status: DC | PRN

## 2020-02-02 MED ORDER — NICOTINE 14 MG/24HR TD PT24
14.0000 mg | MEDICATED_PATCH | Freq: Every day | TRANSDERMAL | Status: DC
  Administered 2020-02-02 – 2020-02-15 (×5): 14 mg via TRANSDERMAL
  Filled 2020-02-02 (×13): qty 1

## 2020-02-02 MED ORDER — SODIUM CHLORIDE 0.9 % IV SOLN: INTRAVENOUS | Status: DC

## 2020-02-02 MED ORDER — THIAMINE HCL 100 MG PO TABS
100.0000 mg | ORAL_TABLET | Freq: Every day | ORAL | Status: DC
  Administered 2020-02-02 – 2020-02-03 (×2): 100 mg via ORAL
  Filled 2020-02-02 (×2): qty 1

## 2020-02-02 MED ORDER — SODIUM CHLORIDE 0.9% FLUSH
3.0000 mL | Freq: Two times a day (BID) | INTRAVENOUS | Status: DC
  Administered 2020-02-02: 3 mL via INTRAVENOUS

## 2020-02-02 MED ORDER — INFLUENZA VAC SPLIT QUAD 0.5 ML IM SUSY: 0.5000 mL | PREFILLED_SYRINGE | INTRAMUSCULAR | Status: DC | PRN

## 2020-02-02 MED ORDER — MOMETASONE FURO-FORMOTEROL FUM 200-5 MCG/ACT IN AERO
2.0000 | INHALATION_SPRAY | Freq: Two times a day (BID) | RESPIRATORY_TRACT | Status: DC
  Filled 2020-02-02: qty 8.8

## 2020-02-02 MED ORDER — HEPARIN SODIUM (PORCINE) 5000 UNIT/ML IJ SOLN
5000.0000 [IU] | Freq: Three times a day (TID) | INTRAMUSCULAR | Status: DC
  Administered 2020-02-02 – 2020-02-03 (×2): 5000 [IU] via SUBCUTANEOUS
  Filled 2020-02-02 (×2): qty 1

## 2020-02-02 MED ORDER — SODIUM CHLORIDE 0.9 % IV BOLUS
1500.0000 mL | Freq: Once | INTRAVENOUS | Status: AC
  Administered 2020-02-02: 1500 mL via INTRAVENOUS

## 2020-02-02 MED ORDER — SODIUM CHLORIDE 0.9 % IV SOLN
2.0000 g | Freq: Three times a day (TID) | INTRAVENOUS | Status: DC
  Administered 2020-02-02 – 2020-02-05 (×9): 2 g via INTRAVENOUS
  Filled 2020-02-02 (×9): qty 2

## 2020-02-02 MED ORDER — GUAIFENESIN ER 600 MG PO TB12
600.0000 mg | ORAL_TABLET | Freq: Two times a day (BID) | ORAL | Status: DC
  Administered 2020-02-02 – 2020-02-16 (×28): 600 mg via ORAL
  Filled 2020-02-02 (×28): qty 1

## 2020-02-02 MED ORDER — ALBUTEROL SULFATE HFA 108 (90 BASE) MCG/ACT IN AERS
2.0000 | INHALATION_SPRAY | Freq: Once | RESPIRATORY_TRACT | Status: AC
  Administered 2020-02-02: 2 via RESPIRATORY_TRACT
  Filled 2020-02-02: qty 6.7

## 2020-02-02 MED ORDER — OXYCODONE HCL 5 MG PO TABS
5.0000 mg | ORAL_TABLET | ORAL | Status: DC | PRN
  Administered 2020-02-02 – 2020-02-03 (×3): 5 mg via ORAL
  Filled 2020-02-02 (×3): qty 1

## 2020-02-02 MED ORDER — ACETAMINOPHEN 650 MG RE SUPP: 650.0000 mg | Freq: Four times a day (QID) | RECTAL | Status: DC | PRN

## 2020-02-02 MED ORDER — PREDNISONE 50 MG PO TABS
60.0000 mg | ORAL_TABLET | Freq: Once | ORAL | Status: AC
  Administered 2020-02-02: 60 mg via ORAL
  Filled 2020-02-02: qty 1

## 2020-02-02 MED ORDER — SODIUM CHLORIDE 0.9 % IV SOLN: 250.0000 mL | INTRAVENOUS | Status: DC | PRN

## 2020-02-02 MED ORDER — POLYETHYLENE GLYCOL 3350 17 G PO PACK
17.0000 g | PACK | Freq: Every day | ORAL | Status: DC | PRN
  Administered 2020-02-06: 17 g via ORAL
  Filled 2020-02-02: qty 1

## 2020-02-02 MED ORDER — IOHEXOL 350 MG/ML SOLN
100.0000 mL | Freq: Once | INTRAVENOUS | Status: AC | PRN
  Administered 2020-02-02: 100 mL via INTRAVENOUS

## 2020-02-02 MED ORDER — BISACODYL 10 MG RE SUPP: 10.0000 mg | Freq: Every day | RECTAL | Status: DC | PRN

## 2020-02-02 MED ORDER — VANCOMYCIN HCL IN DEXTROSE 1-5 GM/200ML-% IV SOLN
1000.0000 mg | Freq: Two times a day (BID) | INTRAVENOUS | Status: DC
  Administered 2020-02-03: 1000 mg via INTRAVENOUS
  Filled 2020-02-02 (×2): qty 200

## 2020-02-02 MED ORDER — ACETAMINOPHEN 325 MG PO TABS: 650.0000 mg | ORAL_TABLET | Freq: Four times a day (QID) | ORAL | Status: DC | PRN

## 2020-02-02 MED ORDER — FOLIC ACID 1 MG PO TABS
1.0000 mg | ORAL_TABLET | Freq: Every day | ORAL | Status: DC
  Administered 2020-02-02 – 2020-02-16 (×14): 1 mg via ORAL
  Filled 2020-02-02 (×14): qty 1

## 2020-02-02 MED ORDER — SODIUM CHLORIDE 0.9 % IV SOLN
2.0000 g | Freq: Once | INTRAVENOUS | Status: AC
  Administered 2020-02-02: 2 g via INTRAVENOUS
  Filled 2020-02-02: qty 2

## 2020-02-02 MED ORDER — ADULT MULTIVITAMIN W/MINERALS CH
1.0000 | ORAL_TABLET | Freq: Every day | ORAL | Status: DC
  Administered 2020-02-02 – 2020-02-16 (×14): 1 via ORAL
  Filled 2020-02-02 (×14): qty 1

## 2020-02-02 MED ORDER — PNEUMOCOCCAL VAC POLYVALENT 25 MCG/0.5ML IJ INJ: 0.5000 mL | INJECTION | INTRAMUSCULAR | Status: DC | PRN

## 2020-02-02 MED ORDER — VANCOMYCIN HCL IN DEXTROSE 1-5 GM/200ML-% IV SOLN
1000.0000 mg | Freq: Once | INTRAVENOUS | Status: DC
  Filled 2020-02-02: qty 200

## 2020-02-02 MED ORDER — ALBUTEROL SULFATE (2.5 MG/3ML) 0.083% IN NEBU
2.5000 mg | INHALATION_SOLUTION | RESPIRATORY_TRACT | Status: DC | PRN
  Administered 2020-02-03 (×2): 2.5 mg via RESPIRATORY_TRACT
  Filled 2020-02-02 (×2): qty 3

## 2020-02-02 NOTE — ED Triage Notes (Signed)
Pt c/o shortness of breath for the past few weeks after an admission for PNU on 01/06/20.   Pt's oxygen saturation is 100 percent in triage.

## 2020-02-02 NOTE — ED Provider Notes (Signed)
[facility] EMERGENCY DEPARTMENT Provider Note   CSN: [id number] Arrival date & time: 02/02/20  1146     History Chief Complaint  Patient presents with  . Shortness of Breath    Francisco Pittman is a 59 y.o. male.  Patient states she was treated for pneumonia in the hospital and was discharged but 3 weeks ago in the last few days he has had fevers and chills admit shortness of breath  The history is provided by the patient and medical records. No language interpreter was used.  Shortness of Breath Severity:  Moderate Onset quality:  Sudden Timing:  Constant Progression:  Worsening Chronicity:  Recurrent Context: activity   Relieved by:  Nothing Worsened by:  Nothing Ineffective treatments:  None tried Associated symptoms: no abdominal pain, no chest pain, no cough, no headaches and no rash   Risk factors: no recent alcohol use        Past Medical History:  Diagnosis Date  . History of pneumonia   . Pneumothorax on right   . Right clavicle fracture   . Urolithiasis     Patient Active Problem List   Diagnosis Date Noted  . Sepsis (HCC) 02/02/2020  . Multifocal pneumonia 01/07/2020  . Hyponatremia 01/07/2020  . Normocytic anemia 01/07/2020  . Thrombocytosis 01/07/2020  . Protein calorie malnutrition (HCC) 01/07/2020    Past Surgical History:  Procedure Laterality Date  . CHEST TUBE INSERTION Right 2012       Family History  Problem Relation Age of Onset  . Lung cancer Mother   . Lung cancer Father     Social History   Tobacco Use  . Smoking status: Current Every Day Smoker    Packs/day: 1.00    Years: 48.00    Pack years: 48.00  . Smokeless tobacco: Never Used  Vaping Use  . Vaping Use: Never used  Substance Use Topics  . Alcohol use: Never  . Drug use: Never    Home Medications Prior to Admission medications   Medication Sig Start Date End Date Taking? Authorizing Provider  albuterol (VENTOLIN HFA) 108 (90 Base) MCG/ACT inhaler Inhale  2 puffs into the lungs every 6 (six) hours as needed for wheezing or shortness of breath. 01/09/20  Yes Erick Blinks, MD  guaiFENesin (MUCINEX) 600 MG 12 hr tablet Take 1 tablet (600 mg total) by mouth 2 (two) times daily. 01/09/20 01/08/21 Yes Erick Blinks, MD  nicotine (NICODERM CQ - DOSED IN MG/24 HOURS) 21 mg/24hr patch Place 1 patch (21 mg total) onto the skin daily. 01/09/20 01/08/21 Yes Erick Blinks, MD    Allergies    Patient has no known allergies.  Review of Systems   Review of Systems  Constitutional: Negative for appetite change and fatigue.  HENT: Negative for congestion, ear discharge and sinus pressure.   Eyes: Negative for discharge.  Respiratory: Positive for shortness of breath. Negative for cough.   Cardiovascular: Negative for chest pain.  Gastrointestinal: Negative for abdominal pain and diarrhea.  Genitourinary: Negative for frequency and hematuria.  Musculoskeletal: Negative for back pain.  Skin: Negative for rash.  Neurological: Negative for seizures and headaches.  Psychiatric/Behavioral: Negative for hallucinations.    Physical Exam Updated Vital Signs BP 115/62   Pulse 82   Temp (!) 97.5 F (36.4 C) (Oral)   Resp (!) 23   Ht 5\' 7"  (1.702 m)   Wt 71.2 kg   SpO2 98%   BMI 24.59 kg/m   Physical Exam Vitals and nursing note reviewed.  Constitutional:      Appearance: He is well-developed.  HENT:     Head: Normocephalic.     Nose: Nose normal.  Eyes:     General: No scleral icterus.    Conjunctiva/sclera: Conjunctivae normal.  Neck:     Thyroid: No thyromegaly.  Cardiovascular:     Rate and Rhythm: Normal rate and regular rhythm.     Heart sounds: No murmur heard.  No friction rub. No gallop.   Pulmonary:     Breath sounds: No stridor. No wheezing or rales.  Chest:     Chest wall: No tenderness.  Abdominal:     General: There is no distension.     Tenderness: There is no abdominal tenderness. There is no rebound.    Musculoskeletal:        General: Normal range of motion.     Cervical back: Neck supple.  Lymphadenopathy:     Cervical: No cervical adenopathy.  Skin:    Findings: No erythema or rash.  Neurological:     Mental Status: He is alert and oriented to person, place, and time.     Motor: No abnormal muscle tone.     Coordination: Coordination normal.  Psychiatric:        Behavior: Behavior normal.     ED Results / Procedures / Treatments   Labs (all labs ordered are listed, but only abnormal results are displayed) Labs Reviewed  CBC WITH DIFFERENTIAL/PLATELET - Abnormal; Notable for the following components:      Result Value   WBC 28.0 (*)    RBC 3.61 (*)    Hemoglobin 10.5 (*)    HCT 32.9 (*)    Platelets 500 (*)    Neutro Abs 23.8 (*)    Monocytes Absolute 2.6 (*)    Abs Immature Granulocytes 0.22 (*)    All other components within normal limits  COMPREHENSIVE METABOLIC PANEL - Abnormal; Notable for the following components:   Glucose, Bld 117 (*)    BUN 29 (*)    Calcium 8.6 (*)    Albumin 2.6 (*)    Alkaline Phosphatase 140 (*)    All other components within normal limits  CULTURE, BLOOD (ROUTINE X 2)  CULTURE, BLOOD (ROUTINE X 2)  URINE CULTURE  RESP PANEL BY RT-PCR (FLU A&B, COVID) ARPGX2  LACTIC ACID, PLASMA  LACTIC ACID, PLASMA  PROTIME-INR  APTT  URINALYSIS, ROUTINE W REFLEX MICROSCOPIC    EKG None  Radiology DG Chest Port 1 View  Result Date: 02/02/2020 CLINICAL DATA:  Shortness of breath EXAM: PORTABLE CHEST 1 VIEW COMPARISON:  January 06, 2020 FINDINGS: There is a moderate pleural effusion on the left. There is extensive airspace opacity throughout much of the left lung consistent with extensive progression of pneumonia since 1 month prior. There is ill-defined opacity in the right upper lobe, less prominent than on previous study. Heart size and pulmonary vascularity are normal. No adenopathy. No bone lesions. IMPRESSION: Widespread airspace opacity  throughout the left lung consistent with extensive progression of pneumonia. Pleural effusion also noted on the left, moderate in size. Less infiltrate right upper lobe compared to previous study. Right lung otherwise clear. Grossly stable cardiac silhouette. Electronically Signed   By: Bretta Bang III M.D.   On: 02/02/2020 13:19    Procedures Procedures (including critical care time)  Medications Ordered in ED Medications  ceFEPIme (MAXIPIME) 2 g in sodium chloride 0.9 % 100 mL IVPB (has no administration in time range)  sodium chloride  0.9 % bolus 1,500 mL (has no administration in time range)  vancomycin (VANCOREADY) IVPB 1500 mg/300 mL (has no administration in time range)    Followed by  vancomycin (VANCOCIN) IVPB 1000 mg/200 mL premix (has no administration in time range)  ceFEPIme (MAXIPIME) 2 g in sodium chloride 0.9 % 100 mL IVPB (has no administration in time range)  predniSONE (DELTASONE) tablet 60 mg (60 mg Oral Given 02/02/20 1301)  albuterol (VENTOLIN HFA) 108 (90 Base) MCG/ACT inhaler 2 puff (2 puffs Inhalation Given 02/02/20 1302)    ED Course  I have reviewed the triage vital signs and the nursing notes.  Pertinent labs & imaging results that were available during my care of the patient were reviewed by me and considered in my medical decision making (see chart for details).    MDM Rules/Calculators/A&P                         Patient with recurrent of pneumonia and pleural effusion he will be admitted to medicine. Final Clinical Impression(s) / ED Diagnoses Final diagnoses:  Healthcare-associated pneumonia    Rx / DC Orders ED Discharge Orders    None       Bethann Berkshire, MD 02/08/20 [phone]

## 2020-02-02 NOTE — ED Notes (Signed)
Pt given medication and inhaler.  Pt educated on use of inhaler.  Pt demonstrated use.
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
## 2020-02-02 NOTE — Plan of Care (Signed)

## 2020-02-02 NOTE — Progress Notes (Signed)
Paged [facility] to notify of patient arrival to 2c09 via [facility].  Pt A&O and in no distress.  RA.  Paged Dr. Laneta Simmers to notify of patient arrival.  Will be in to see patient tomorrow.

## 2020-02-02 NOTE — ED Notes (Signed)
Right upper buttocks stage I pressure ulcer

## 2020-02-02 NOTE — ED Notes (Signed)
Gram stain results: gram+ cocci and gram+ rods

## 2020-02-02 NOTE — Sepsis Progress Note (Signed)
Secure chat with direct patient care RN Arlina Robes, who confirmed that the blood cultures were in fact drawn prior to the antibiotic dosing.

## 2020-02-02 NOTE — Progress Notes (Signed)
  Left-sided ultrasound-guided thoracentesis today consistent with foul-smelling purulent pleural fluid--- about 180 mL removed --Concern is for loculated infected left pleural fluid consistent with empyema --Called and discussed case with Dr. Laneta Simmers the on-call CT surgeon -- -Pleural fluid studies are pending -Please notify/call Dr. Marcelyn Bruins surgery when patient arrives at [facility]  -Please see full H&P dictated earlier today Shon Hale, MD

## 2020-02-02 NOTE — ED Notes (Signed)
ABX given after BC drawn

## 2020-02-02 NOTE — Procedures (Signed)
PreOperative Dx: LEFT pleural effusion Postoperative Dx: LEFT empyema Procedure:   US guided LEFT thoracentesis Radiologist:  Tyron Russell Anesthesia:  10 ml of 1% lidocaine Specimen:  180 mL of pus-colored foul-smelling fluid EBL:   < 1 ml Complications: None

## 2020-02-02 NOTE — Progress Notes (Signed)
Pharmacy Antibiotic Note  Francisco Pittman is a 59 y.o. male admitted on 02/02/2020 with HCAP.  Pharmacy has been consulted for Vancomycin and cefepime dosing.  Plan: Vancomycin 1500mg  IV loading dose, then 1000mg   IV every 12 hours.  Goal trough 15-20 mcg/mL.  Cefepime 2gm IV q8h F/U cxs and clinical progress Monitor V/S, labs and levels as indicated  Height: 5\' 7"  (170.2 cm) Weight: 71.2 kg (157 lb) IBW/kg (Calculated) : 66.1  Temp (24hrs), Avg:97.5 F (36.4 C), Min:97.5 F (36.4 C), Max:97.5 F (36.4 C)  Recent Labs  Lab 02/02/20 1339  WBC 28.0*  CREATININE 1.11    Estimated Creatinine Clearance: 65.3 mL/min (by C-G formula based on SCr of 1.11 mg/dL).    No Known Allergies  Antimicrobials this admission: Vancomycin 11/24>>  Cefepime 11/24 >>   Dose adjustments this admission: prn  Microbiology results: 11/24 BCx: pending 11/24 UCx: pending  MRSA PCR:   Thank you for allowing pharmacy to be a part of this patient's care.  12/24, BS Pharm D, 12/24 Clinical Pharmacist Pager [phone] 02/02/2020 3:24 PM

## 2020-02-02 NOTE — ED Notes (Signed)
Pt having [place] procedure at this time

## 2020-02-02 NOTE — Sepsis Progress Note (Signed)
[facility] following this Code Sepsis. 

## 2020-02-03 ENCOUNTER — Inpatient Hospital Stay (HOSPITAL_COMMUNITY): Payer: Medicaid - Out of State

## 2020-02-03 DIAGNOSIS — R0602 Shortness of breath: Secondary | ICD-10-CM | POA: Diagnosis not present

## 2020-02-03 DIAGNOSIS — I371 Nonrheumatic pulmonary valve insufficiency: Secondary | ICD-10-CM | POA: Diagnosis not present

## 2020-02-03 DIAGNOSIS — A419 Sepsis, unspecified organism: Secondary | ICD-10-CM | POA: Diagnosis not present

## 2020-02-03 DIAGNOSIS — R6520 Severe sepsis without septic shock: Secondary | ICD-10-CM | POA: Diagnosis not present

## 2020-02-03 DIAGNOSIS — J869 Pyothorax without fistula: Secondary | ICD-10-CM

## 2020-02-03 LAB — ECHOCARDIOGRAM COMPLETE
Area-P 1/2: 5.16 cm2
Height: 67 in
S' Lateral: 3.2 cm
Single Plane A4C EF: 69.1 %
Weight: 2411.2 oz

## 2020-02-03 LAB — SURGICAL PCR SCREEN
MRSA, PCR: NEGATIVE
Staphylococcus aureus: NEGATIVE

## 2020-02-03 LAB — PREPARE RBC (CROSSMATCH)

## 2020-02-03 MED ORDER — ENOXAPARIN SODIUM 40 MG/0.4ML ~~LOC~~ SOLN: 40.0000 mg | SUBCUTANEOUS | Status: DC

## 2020-02-03 MED ORDER — SODIUM CHLORIDE 0.9% IV SOLUTION: Freq: Once | INTRAVENOUS | Status: DC

## 2020-02-03 MED ORDER — OXYCODONE HCL 5 MG PO TABS
10.0000 mg | ORAL_TABLET | ORAL | Status: DC | PRN
  Administered 2020-02-03 – 2020-02-04 (×3): 10 mg via ORAL
  Filled 2020-02-03 (×3): qty 2

## 2020-02-03 MED ORDER — VANCOMYCIN HCL 750 MG/150ML IV SOLN
750.0000 mg | Freq: Two times a day (BID) | INTRAVENOUS | Status: DC
  Administered 2020-02-03 – 2020-02-05 (×4): 750 mg via INTRAVENOUS
  Filled 2020-02-03 (×6): qty 150

## 2020-02-03 MED ORDER — ENOXAPARIN SODIUM 40 MG/0.4ML ~~LOC~~ SOLN
40.0000 mg | SUBCUTANEOUS | Status: DC
Start: 2020-02-03 — End: 2020-02-03

## 2020-02-03 MED ORDER — METRONIDAZOLE 500 MG PO TABS
500.0000 mg | ORAL_TABLET | Freq: Three times a day (TID) | ORAL | Status: DC
  Administered 2020-02-03 – 2020-02-05 (×8): 500 mg via ORAL
  Filled 2020-02-03 (×9): qty 1

## 2020-02-03 NOTE — Progress Notes (Signed)
[facility] PROGRESS NOTE    Francisco SpellerLuther Pittman  AVW:[id number] DOB: June 19, 1960 DOA: 02/02/2020 PCP: Patient, No Pcp Per      Brief Narrative:  Francisco Pittman is a 59 y.o. M with smoking, EtOH use who presents with non-resolving pain, cough, malaise after recent pneumonia.  Patient admitted to APS last month for multifocal pneumonia, treated with IV antibiotics, and discharged with oral antibiotics.  After completion of oral antibiotics, he continued to have severe left-sided pain, malaise, chills, and cough, and so he returned to the ER.  On readmission, CT of the chest showed a large loculated pleural effusion, progressed from previous.  Thoracentesis was obtained and about 200 cc of purulent foul-smelling fluid returned, which had polymicrobial Gram stain.  Case was discussed with CT surgery, who appear to have recommended transfer for VATS.          Assessment & Plan:  Empyema Patient with large loculated left lower lobe parapneumonic effusion, aspirate frankly purulent with positive Gram stain.  Recent hospitalization with IV antibiotics. -Continue cefepime and vancomycin -Add Flagyl -Consult CT surgery, they appear to anticipate VATS tomorrow     Smoking Smoking cessation recommended, modalities discussed  Possible COPD This is a likely although unconfirmed diagnosis.  Not his primary symptomology, although he is wheezing a little bit -Continue albuterol as needed  Alcohol use No evidence of withdrawal -Continue folate and thiamine   Anemia Likely from chronic inflammation, normocytic       Disposition: Status is: Inpatient  Remains inpatient appropriate because:Ongoing diagnostic testing needed not appropriate for outpatient work up   Dispo: The patient is from: Home              Anticipated d/c is to: Home              Anticipated d/c date is: > 3 days              Patient currently is not medically stable to d/c.     Patient  admitted with empyema.  VATS tomorrow, chest tubes will need to be removed after that prior to discharge.  Likely a while until he is ready for discharge.         MDM: The below labs and imaging reports were reviewed and summarized above.  Medication management as above.    DVT prophylaxis: enoxaparin (LOVENOX) injection 40 mg Start: 02/03/20 1400  Code Status: Full code Family Communication:     Consultants:   CT surgery  Procedures:   1/24 thoracentesis  Antimicrobials:   Vancomycin and cefepime 11/24>>  Flagyl 11/25  Culture data:   Blood cultures 1/24-no growth to date  Body fluid culture from effusion/thoracentesis 11/24-Gram stain positive          Subjective: Patient has severe left-sided pain.  Worse with inspiration.  Mild headache, mild chest pain, mild cough, nonproductive.  No fever No confusion.  Objective: Vitals:   02/02/20 2130 02/02/20 2231 02/03/20 0325 02/03/20 0747  BP: (!) 110/53 123/88 119/72 129/63  Pulse: 89 92 76 87  Resp: (!) 21 20 20 20   Temp: 97.7 F (36.5 C) 97.7 F (36.5 C) 98.1 F (36.7 C) 97.8 F (36.6 C)  TempSrc: Oral Oral Oral Oral  SpO2: 95% 91% 90% 95%  Weight:  68.4 kg    Height:  5\' 7"  (1.702 m)      Intake/Output Summary (Last 24 hours) at 02/03/2020 0758 Last data filed at 02/03/2020 [id number] Gross per 24 hour  Intake  2260 ml  Output 500 ml  Net 1760 ml   Filed Weights   02/02/20 1208 02/02/20 2231  Weight: 71.2 kg 68.4 kg    Examination: General appearance:  adult male, alert and in moderate distress from pain.   HEENT: Anicteric, conjunctiva pink, lids and lashes normal. No nasal deformity, discharge, epistaxis.  Lips moist, dentition in poor repair, oropharynx moist, no oral lesions.   Skin: Warm and dry.  No jaundice.  No suspicious rashes or lesions. Cardiac: RRR, nl S1-S2, no murmurs appreciated.  Capillary refill is brisk.  JVP normal.  No LE edema.  Radial pulses 2+ and  symmetric. Respiratory: Normal respiratory rate and rhythm.  CTAB without rales or wheezes.  Diminished in the left. Abdomen: Abdomen soft.  No TTP or guarding. No ascites, distension, hepatosplenomegaly.   MSK: No deformities or effusions. Neuro: Awake and alert.  EOMI, moves all extremities. Speech fluent.    Psych: Sensorium intact and responding to questions, attention normal. Affect normal.  Judgment and insight appear normal.    Data Reviewed: I have personally reviewed following labs and imaging studies:  CBC: Recent Labs  Lab 02/02/20 1339  WBC 28.0*  NEUTROABS 23.8*  HGB 10.5*  HCT 32.9*  MCV 91.1  PLT 500*   Basic Metabolic Panel: Recent Labs  Lab 02/02/20 1339  NA 136  K 3.6  CL 102  CO2 23  GLUCOSE 117*  BUN 29*  CREATININE 1.11  CALCIUM 8.6*   GFR: Estimated Creatinine Clearance: 65.3 mL/min (by C-G formula based on SCr of 1.11 mg/dL). Liver Function Tests: Recent Labs  Lab 02/02/20 1339 02/02/20 1903  AST 28  --   ALT 17  --   ALKPHOS 140*  --   BILITOT 1.0  --   PROT 7.4 6.2*  ALBUMIN 2.6* 2.0*   No results for input(s): LIPASE, AMYLASE in the last 168 hours. No results for input(s): AMMONIA in the last 168 hours. Coagulation Profile: Recent Labs  Lab 02/02/20 1903  INR 1.3*   Cardiac Enzymes: No results for input(s): CKTOTAL, CKMB, CKMBINDEX, TROPONINI in the last 168 hours. BNP (last 3 results) No results for input(s): PROBNP in the last 8760 hours. HbA1C: No results for input(s): HGBA1C in the last 72 hours. CBG: No results for input(s): GLUCAP in the last 168 hours. Lipid Profile: No results for input(s): CHOL, HDL, LDLCALC, TRIG, CHOLHDL, LDLDIRECT in the last 72 hours. Thyroid Function Tests: No results for input(s): TSH, T4TOTAL, FREET4, T3FREE, THYROIDAB in the last 72 hours. Anemia Panel: No results for input(s): VITAMINB12, FOLATE, FERRITIN, TIBC, IRON, RETICCTPCT in the last 72 hours. Urine analysis:    Component Value  Date/Time   COLORURINE YELLOW 02/02/2020 1640   APPEARANCEUR HAZY (A) 02/02/2020 1640   LABSPEC 1.020 02/02/2020 1640   PHURINE 5.0 02/02/2020 1640   GLUCOSEU NEGATIVE 02/02/2020 1640   HGBUR NEGATIVE 02/02/2020 1640   BILIRUBINUR NEGATIVE 02/02/2020 1640   KETONESUR 5 (A) 02/02/2020 1640   PROTEINUR 30 (A) 02/02/2020 1640   NITRITE NEGATIVE 02/02/2020 1640   LEUKOCYTESUR NEGATIVE 02/02/2020 1640   Sepsis Labs: @LABRCNTIP (procalcitonin:4,lacticacidven:4)  ) Recent Results (from the past 240 hour(s))  Resp Panel by RT-PCR (Flu A&B, Covid) Nasopharyngeal Swab     Status: None   Collection Time: 02/02/20  3:21 PM   Specimen: Nasopharyngeal Swab; Nasopharyngeal(NP) swabs in vial transport medium  Result Value Ref Range Status   SARS Coronavirus 2 by RT PCR NEGATIVE NEGATIVE Final    Comment: (NOTE) SARS-CoV-2  target nucleic acids are NOT DETECTED.  The SARS-CoV-2 RNA is generally detectable in upper respiratory specimens during the acute phase of infection. The lowest concentration of SARS-CoV-2 viral copies this assay can detect is 138 copies/mL. A negative result does not preclude SARS-Cov-2 infection and should not be used as the sole basis for treatment or other patient management decisions. A negative result may occur with  improper specimen collection/handling, submission of specimen other than nasopharyngeal swab, presence of viral mutation(s) within the areas targeted by this assay, and inadequate number of viral copies(<138 copies/mL). A negative result must be combined with clinical observations, patient history, and epidemiological information. The expected result is Negative.  Fact Sheet for Patients:  BloggerCourse.com  Fact Sheet for Healthcare Providers:  SeriousBroker.it  This test is no t yet approved or cleared by the [place] FDA and  has been authorized for detection and/or diagnosis of SARS-CoV-2  by FDA under an Emergency Use Authorization (EUA). This EUA will remain  in effect (meaning this test can be used) for the duration of the COVID-19 declaration under Section 564(b)(1) of the Act, 21 U.S.C.section 360bbb-3(b)(1), unless the authorization is terminated  or revoked sooner.       Influenza A by PCR NEGATIVE NEGATIVE Final   Influenza B by PCR NEGATIVE NEGATIVE Final    Comment: (NOTE) The Xpert Xpress SARS-CoV-2/FLU/RSV plus assay is intended as an aid in the diagnosis of influenza from Nasopharyngeal swab specimens and should not be used as a sole basis for treatment. Nasal washings and aspirates are unacceptable for Xpert Xpress SARS-CoV-2/FLU/RSV testing.  Fact Sheet for Patients: BloggerCourse.com  Fact Sheet for Healthcare Providers: SeriousBroker.it  This test is not yet approved or cleared by the [place] FDA and has been authorized for detection and/or diagnosis of SARS-CoV-2 by FDA under an Emergency Use Authorization (EUA). This EUA will remain in effect (meaning this test can be used) for the duration of the COVID-19 declaration under Section 564(b)(1) of the Act, 21 U.S.C. section 360bbb-3(b)(1), unless the authorization is terminated or revoked.  Performed at [facility], [address] [place], [place] [address]   Blood Culture (routine x 2)     Status: None (Preliminary result)   Collection Time: 02/02/20  3:41 PM   Specimen: BLOOD  Result Value Ref Range Status   Specimen Description BLOOD  Final   Special Requests NONE  Final   Culture   Final    NO GROWTH < 24 HOURS Performed at [facility], [address], [place], [place] [address]    Report Status PENDING  Incomplete  Blood Culture (routine x 2)     Status: None (Preliminary result)   Collection Time: 02/02/20  3:41 PM   Specimen: BLOOD  Result Value Ref Range Status   Specimen Description BLOOD LEFT ANTECUBITAL  Final   Special  Requests   Final    BOTTLES DRAWN AEROBIC AND ANAEROBIC Blood Culture adequate volume   Culture   Final    NO GROWTH < 24 HOURS Performed at [facility], [address], [place], [place] [address]    Report Status PENDING  Incomplete  Culture, body fluid-bottle     Status: None (Preliminary result)   Collection Time: 02/02/20  5:40 PM   Specimen: Pleura  Result Value Ref Range Status   Specimen Description PLEURAL  Final   Special Requests BOTTLES DRAWN AEROBIC AND ANAEROBIC 10CC  Final   Gram Stain   Final    GRAM POSITIVE  COCCI IN BOTH AEROBIC AND ANAEROBIC BOTTLES Gram Stain Report Called to,Read Back By and Verified With: T Arlis Porta @2140  02/02/20 MKELLY    Culture   Final    NO GROWTH < 12 HOURS Performed at [facility], [address], [place], [place]    Report Status PENDING  Incomplete  Gram stain     Status: None   Collection Time: 02/02/20  5:40 PM   Specimen: Pleura  Result Value Ref Range Status   Specimen Description PLEURAL  Final   Special Requests NONE  Final   Gram Stain   Final    ABUNDANT GRAM POSITIVE COCCI RARE GRAM POSITIVE RODS WBC PRESENT, PREDOMINANTLY MONONUCLEAR Gram Stain Report Called to,Read Back By and Verified With: TONI WALKER@1936  02/02/20 BY JONES,T Performed at [facility], [address], [place], [place]    Report Status 02/02/2020 FINAL  Final  Surgical pcr screen     Status: None   Collection Time: 02/02/20 10:39 PM   Specimen: Nasal Mucosa; Nasal Swab  Result Value Ref Range Status   MRSA, PCR NEGATIVE NEGATIVE Final   Staphylococcus aureus NEGATIVE NEGATIVE Final    Comment: (NOTE) The Xpert SA Assay (FDA approved for NASAL specimens in patients 8 years of age and older), is one component of a comprehensive surveillance program. It is not intended to diagnose infection nor to guide or monitor treatment. Performed at [facility], [address], [place], [place]          Radiology  Studies: CT ANGIO CHEST PE W OR WO CONTRAST  Result Date: 02/02/2020 CLINICAL DATA:  Chest pain and pleurisy. Shortness of breath over the last several weeks. EXAM: CT ANGIOGRAPHY CHEST WITH CONTRAST TECHNIQUE: Multidetector CT imaging of the chest was performed using the standard protocol during bolus administration of intravenous contrast. Multiplanar CT image reconstructions and MIPs were obtained to evaluate the vascular anatomy. CONTRAST:  02/04/2020 OMNIPAQUE IOHEXOL 350 MG/ML SOLN COMPARISON:  Chest radiography same day.  Chest CT 11/06/2019. FINDINGS: Cardiovascular: Heart size is normal. No pericardial effusion. Aortic atherosclerosis is present without evidence of aneurysm or dissection. No pulmonary emboli are seen. Mediastinum/Nodes: No mediastinal or hilar mass or lymphadenopathy, other than mild reactive nodal prominence. Lungs/Pleura: Background emphysema. In the right chest, previously seen irregular infiltrate in the right upper lobe is resolving. There does remain some spiculated density in this region. Further follow-up of this is suggested to ensure complete clearing. On the left, patient has multiple loculated pleural fluid collections with air-fluid levels consistent with empyema. This is quite extensive. There is collapse of the left lower lobe. There may be a peripheral left lower lobe abscess. Spiculated density in the left upper lobe presumed related infiltrate previously is improving. There is some compressive atelectasis of the left upper lobe due to pleural collections as well. Upper Abdomen: Negative Musculoskeletal: Old partial compression fracture of T12. No acute bone finding. Review of the MIP images confirms the above findings. IMPRESSION: 1. Development of multiple loculated pleural fluid collections on the left consistent with extensive and widespread empyema formation. 2. Possible peripheral pulmonary abscess in the left lower lobe. 3. Marked compressive volume loss of the left  lung. 4. Improving areas of infiltrate in the right upper lobe and left lower lobe compared to the previous study. Additional follow-up suggested to ensure complete clearing. 5. Background emphysema. 6. Aortic atherosclerosis. Aortic Atherosclerosis (ICD10-I70.0) and Emphysema (ICD10-J43.9). Electronically Signed   By: 11/08/2019.D.  On: 02/02/2020 19:21   DG Chest Port 1 View  Result Date: 02/02/2020 CLINICAL DATA:  LEFT pleural effusion post thoracentesis; aspirated fluid grossly purulent and foul-smelling consistent with empyema EXAM: PORTABLE CHEST 1 VIEW COMPARISON:  Portable exam 1803 hours compared to 1306 hours FINDINGS: Upper normal heart size. Complicated partially loculated LEFT pleural fluid collection with significant atelectasis and consolidation. Appearance unchanged from earlier study. No pneumothorax. Atelectasis versus infiltrate or scarring in the RIGHT upper lobe unchanged. IMPRESSION: No pneumothorax following LEFT thoracentesis. Persistent complicated LEFT pleural effusion with atelectasis and/or consolidation in LEFT lung. Electronically Signed   By: Ulyses Southward M.D.   On: 02/02/2020 18:10   DG Chest Port 1 View  Result Date: 02/02/2020 CLINICAL DATA:  Shortness of breath EXAM: PORTABLE CHEST 1 VIEW COMPARISON:  January 06, 2020 FINDINGS: There is a moderate pleural effusion on the left. There is extensive airspace opacity throughout much of the left lung consistent with extensive progression of pneumonia since 1 month prior. There is ill-defined opacity in the right upper lobe, less prominent than on previous study. Heart size and pulmonary vascularity are normal. No adenopathy. No bone lesions. IMPRESSION: Widespread airspace opacity throughout the left lung consistent with extensive progression of pneumonia. Pleural effusion also noted on the left, moderate in size. Less infiltrate right upper lobe compared to previous study. Right lung otherwise clear. Grossly stable cardiac  silhouette. Electronically Signed   By: Bretta Bang III M.D.   On: 02/02/2020 13:19   US THORACENTESIS ASP PLEURAL SPACE W/IMG GUIDE  Result Date: 02/02/2020 INDICATION: Recent pneumonia, still not feeling well despite treatment EXAM: ULTRASOUND GUIDED DIAGNOSTIC LEFT THORACENTESIS MEDICATIONS: None. COMPLICATIONS: None immediate. PROCEDURE: An ultrasound guided thoracentesis was thoroughly discussed with the patient and questions answered. The benefits, risks, alternatives and complications were also discussed. The patient understands and wishes to proceed with the procedure. Written consent was obtained. Ultrasound was performed to localize and mark an adequate pocket of fluid in the LEFT chest. Observed LEFT pleural fluid collection is markedly complex, containing scattered echogenic foci question air, complex heterogeneous fluid, and markedly loculated in appearance. The area was then prepped and draped in the normal sterile fashion. 1% Lidocaine was used for local anesthesia. Under ultrasound guidance a 8 French thoracentesis catheter was introduced. Thoracentesis was performed. The catheter was removed and a dressing applied. FINDINGS: A total of approximately 180 mL of grossly purulent and foul smelling fluid was removed. Samples were sent to the laboratory as requested by the clinical team. IMPRESSION: Successful ultrasound guided diagnostic LEFT thoracentesis yielding 180 mL of pleural fluid as above. Sonographic findings in appearance of the fluid are consistent with LEFT empyema. Electronically Signed   By: Ulyses Southward M.D.   On: 02/02/2020 18:07        Scheduled Meds: . enoxaparin (LOVENOX) injection  40 mg Subcutaneous Q24H  . folic acid  1 mg Oral Daily  . guaiFENesin  600 mg Oral BID  . metroNIDAZOLE  500 mg Oral Q8H  . mometasone-formoterol  2 puff Inhalation BID  . multivitamin with minerals  1 tablet Oral Daily  . nicotine  14 mg Transdermal Daily  . thiamine  100 mg Oral  Daily   Continuous Infusions: . ceFEPime (MAXIPIME) IV 2 g (02/03/20 7510)     LOS: 1 day    Time spent: 25 minutes    Alberteen Sam, MD [facility] 02/03/2020, 7:58 AM     Please page though AMION or Epic secure chat:  For [employer], Higher education careers adviser

## 2020-02-03 NOTE — Progress Notes (Signed)
Pharmacy Antibiotic Note  Francisco Pittman is a 59 y.o. male admitted on 02/02/2020 with large loculated left empyema.  Pharmacy has been consulted for Vancomycin and cefepime dosing. Most recent labs yesterday with elevated WBC 28 and Scr 1.11 (CrCl 65 ml/min). Will adjust vancomycin dose accordingly. Last vancomycin dose given ~0400 this AM.  Plan: Adjust Vancomycin to 750 mg IV Q12 hrs starting 1600 today Cefepime 2gm IV q8h Monitor renal function, cultures, clinical progression Check vancomycin levels at steady state F/u post L thoracotomy tomorrow  Height: 5\' 7"  (170.2 cm) Weight: 68.4 kg (150 lb 11.2 oz) IBW/kg (Calculated) : 66.1  Temp (24hrs), Avg:97.8 F (36.6 C), Min:97.5 F (36.4 C), Max:98.1 F (36.7 C)  Recent Labs  Lab 02/02/20 1339 02/02/20 1541 02/02/20 1903  WBC 28.0*  --   --   CREATININE 1.11  --   --   LATICACIDVEN  --  1.2 1.0    Estimated Creatinine Clearance: 65.3 mL/min (by C-G formula based on SCr of 1.11 mg/dL).    No Known Allergies  Antimicrobials this admission: Vancomycin 11/24>>  Cefepime 11/24 >>   Dose adjustments this admission: N/A  Microbiology results: 11/24 pleural fluid: GPC 11/24 BCx: ngtd 11/24 UCx: ngtd MRSA PCR: neg  12/24, PharmD, BCPS PGY2 Cardiology Pharmacy Resident Phone: [phone] 02/03/2020  8:09 AM  Please check [email or web address] for unit-specific pharmacy phone numbers.

## 2020-02-03 NOTE — Progress Notes (Signed)
  Echocardiogram 2D Echocardiogram has been performed.  Roosvelt Maser F 02/03/2020, 12:48 PM

## 2020-02-03 NOTE — Plan of Care (Signed)
  Problem: Education: Goal: Knowledge of General Education information will improve Description: Including pain rating scale, medication(s)/side effects and non-pharmacologic comfort measures Outcome: Progressing   Problem: Health Behavior/Discharge Planning: Goal: Ability to manage health-related needs will improve Outcome: Progressing   Problem: Clinical Measurements: Goal: Ability to maintain clinical measurements within normal limits will improve Outcome: Progressing Goal: Diagnostic test results will improve Outcome: Progressing Goal: Cardiovascular complication will be avoided Outcome: Progressing   Problem: Nutrition: Goal: Adequate nutrition will be maintained Outcome: Progressing   Problem: Coping: Goal: Level of anxiety will decrease Outcome: Progressing   

## 2020-02-03 NOTE — Consult Note (Signed)
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 region. Further follow-up of this is suggested to ensure complete clearing.  On the left, patient has multiple loculated pleural fluid collections with air-fluid levels consistent with empyema. This is quite extensive. There is collapse of the left lower lobe. There may be a peripheral left lower lobe abscess. Spiculated density in the left upper lobe presumed related infiltrate previously is improving. There is some compressive atelectasis of the left upper lobe due to pleural collections as well.  Upper Abdomen: Negative  Musculoskeletal: Old partial compression fracture of T12.  No acute bone finding.  Review of the MIP images confirms the above findings.  IMPRESSION: 1. Development of multiple loculated pleural fluid collections on the left consistent with extensive and widespread empyema formation. 2. Possible peripheral pulmonary abscess in the left lower lobe. 3. Marked compressive volume loss of the left lung. 4. Improving areas of infiltrate in the right upper lobe and left lower lobe compared to the previous study. Additional follow-up suggested to ensure complete clearing. 5. Background emphysema. 6. Aortic atherosclerosis.  Aortic Atherosclerosis (ICD10-I70.0) and Emphysema (ICD10-J43.9).   Electronically Signed   By: Paulina Fusi M.D.   On: 02/02/2020 19:21    Assessment/Plan:  This 59 year old gentleman has multifocal bilateral pneumonia with development of a left empyema.  The right upper lobe infiltrate appears improved compared to his initial CT scan on 01/06/2020 but he has subsequently developed a large loculated left pleural effusion with compressive atelectasis of the left lung and unresolved pneumonia.  His white blood cell count was 28,000 on admission but he remains afebrile.  The pleural fluid was grossly purulent with Gram stain showing gram-positive cocci and gram-positive rods.  He will require left thoracotomy for drainage of the empyema and decortication of the lung.  He should remain on broad-spectrum intravenous antibiotics and I will plan to do  surgery tomorrow.  I discussed the operative procedure of left thoracotomy and drainage of empyema with the patient including alternatives, benefits, and risk including but not limited to bleeding, blood transfusion, persistent infection, lung abscess, pleural space complications, respiratory failure requiring prolonged intubation, and he understands agrees to proceed.  Dr. Vickey Sages is on call for service today if there are any issues but I have schedule him in the operating room for tomorrow  afternoon.  I spent 60 minutes performing this consultation and > 50% of this time was spent face to face counseling and coordinating the care of this patient's left empyema.   Alleen Borne 02/03/2020, 7:35 AM

## 2020-02-04 ENCOUNTER — Inpatient Hospital Stay (HOSPITAL_COMMUNITY): Payer: Medicaid - Out of State

## 2020-02-04 ENCOUNTER — Encounter (HOSPITAL_COMMUNITY)
Admission: EM | Disposition: A | Discharge status: Home / Self Care | Payer: Self-pay | Source: Home / Self Care | Attending: Family Medicine

## 2020-02-04 ENCOUNTER — Inpatient Hospital Stay (HOSPITAL_COMMUNITY): Payer: Medicaid - Out of State | Admitting: Certified Registered Nurse Anesthetist

## 2020-02-04 ENCOUNTER — Encounter (HOSPITAL_COMMUNITY): Payer: Self-pay | Admitting: Family Medicine

## 2020-02-04 DIAGNOSIS — A419 Sepsis, unspecified organism: Secondary | ICD-10-CM | POA: Diagnosis not present

## 2020-02-04 DIAGNOSIS — J869 Pyothorax without fistula: Secondary | ICD-10-CM | POA: Diagnosis not present

## 2020-02-04 DIAGNOSIS — R6520 Severe sepsis without septic shock: Secondary | ICD-10-CM | POA: Diagnosis not present

## 2020-02-04 DIAGNOSIS — Z9889 Other specified postprocedural states: Secondary | ICD-10-CM

## 2020-02-04 HISTORY — PX: VIDEO ASSISTED THORACOSCOPY (VATS)/EMPYEMA: SHX6172

## 2020-02-04 HISTORY — PX: CHEST TUBE INSERTION: SHX231

## 2020-02-04 LAB — COMPREHENSIVE METABOLIC PANEL
ALT: 31 U/L (ref 0–44)
AST: 35 U/L (ref 15–41)
Albumin: 1.6 g/dL — ABNORMAL LOW (ref 3.5–5.0)
Alkaline Phosphatase: 147 U/L — ABNORMAL HIGH (ref 38–126)
Anion gap: 9 (ref 5–15)
BUN: 21 mg/dL (ref 8–23)
CO2: 19 mmol/L — ABNORMAL LOW (ref 22–32)
Calcium: 7.9 mg/dL — ABNORMAL LOW (ref 8.9–10.3)
Chloride: 108 mmol/L (ref 98–111)
Creatinine, Ser: 0.95 mg/dL (ref 0.61–1.24)
GFR, Estimated: >60 mL/min (ref >60)
Glucose, Bld: 106 mg/dL — ABNORMAL HIGH (ref 70–99)
Potassium: 3.7 mmol/L (ref 3.5–5.1)
Sodium: 136 mmol/L (ref 135–145)
Total Bilirubin: 0.9 mg/dL (ref 0.3–1.2)
Total Protein: 5.5 g/dL — ABNORMAL LOW (ref 6.5–8.1)

## 2020-02-04 LAB — PH, BODY FLUID: pH, Body Fluid: 6.4

## 2020-02-04 LAB — CBC
HCT: 27.1 % — ABNORMAL LOW (ref 39.0–52.0)
Hemoglobin: 8.8 g/dL — ABNORMAL LOW (ref 13.0–17.0)
MCH: 29 pg (ref 26.0–34.0)
MCHC: 32.5 g/dL (ref 30.0–36.0)
MCV: 89.4 fL (ref 80.0–100.0)
Platelets: 559 10*3/uL — ABNORMAL HIGH (ref 150–400)
RBC: 3.03 MIL/uL — ABNORMAL LOW (ref 4.22–5.81)
RDW: 13.8 % (ref 11.5–15.5)
WBC: 32.2 10*3/uL — ABNORMAL HIGH (ref 4.0–10.5)
nRBC: 0 % (ref 0.0–0.2)

## 2020-02-04 LAB — URINE CULTURE: Culture: NO GROWTH

## 2020-02-04 LAB — ABO/RH: ABO/RH(D): O POS

## 2020-02-04 LAB — GLUCOSE, CAPILLARY: Glucose-Capillary: 124 mg/dL — ABNORMAL HIGH (ref 70–99)

## 2020-02-04 SURGERY — VIDEO ASSISTED THORACOSCOPY (VATS)/EMPYEMA
Anesthesia: General | Site: Chest | Laterality: Left

## 2020-02-04 MED ORDER — PROPOFOL 10 MG/ML IV BOLUS
INTRAVENOUS | Status: DC | PRN
  Administered 2020-02-04: 100 mg via INTRAVENOUS

## 2020-02-04 MED ORDER — LACTATED RINGERS IV SOLN: INTRAVENOUS | Status: DC

## 2020-02-04 MED ORDER — SODIUM CHLORIDE 0.9 % IV SOLN: INTRAVENOUS | Status: DC | PRN

## 2020-02-04 MED ORDER — SUGAMMADEX SODIUM 200 MG/2ML IV SOLN
INTRAVENOUS | Status: DC | PRN
  Administered 2020-02-04: 200 mg via INTRAVENOUS

## 2020-02-04 MED ORDER — LIDOCAINE 2% (20 MG/ML) 5 ML SYRINGE
INTRAMUSCULAR | Status: DC | PRN
  Administered 2020-02-04: 100 mg via INTRAVENOUS

## 2020-02-04 MED ORDER — MORPHINE SULFATE 2 MG/ML IV SOLN
INTRAVENOUS | Status: DC
  Administered 2020-02-05: 2 mg via INTRAVENOUS
  Administered 2020-02-05 (×2): 7.5 mg via INTRAVENOUS
  Administered 2020-02-05: 4.5 mg via INTRAVENOUS
  Administered 2020-02-05: 6 mg via INTRAVENOUS
  Administered 2020-02-06: 10.5 mg via INTRAVENOUS
  Administered 2020-02-06: 1.5 mg via INTRAVENOUS
  Administered 2020-02-06: 4.5 mg via INTRAVENOUS
  Administered 2020-02-06: 3 mg via INTRAVENOUS
  Administered 2020-02-06: 1.5 mg via INTRAVENOUS
  Administered 2020-02-06: 0 mg via INTRAVENOUS
  Administered 2020-02-06 – 2020-02-07 (×2): 4.5 mg via INTRAVENOUS
  Administered 2020-02-07: 1.5 mg via INTRAVENOUS
  Administered 2020-02-07: 4.5 mg via INTRAVENOUS
  Administered 2020-02-07 (×2): 6 mg via INTRAVENOUS
  Administered 2020-02-08: 13.5 mg via INTRAVENOUS
  Administered 2020-02-08: 9 mg via INTRAVENOUS
  Administered 2020-02-08: 6 mg via INTRAVENOUS
  Administered 2020-02-11: 10.5 mg via INTRAVENOUS
  Administered 2020-02-11: 6 mg via INTRAVENOUS
  Administered 2020-02-11: 13.5 mg via INTRAVENOUS
  Administered 2020-02-13: 0 mg via INTRAVENOUS
  Administered 2020-02-13: 6 mg via INTRAVENOUS
  Administered 2020-02-13: 0 mg via INTRAVENOUS
  Administered 2020-02-13: 9 mg via INTRAVENOUS
  Administered 2020-02-13: 4.5 mg via INTRAVENOUS
  Administered 2020-02-14: 0 mg via INTRAVENOUS
  Filled 2020-02-04 (×2): qty 25
  Filled 2020-02-04: qty 50
  Filled 2020-02-04: qty 25
  Filled 2020-02-04: qty 50
  Filled 2020-02-04: qty 25
  Filled 2020-02-04: qty 50

## 2020-02-04 MED ORDER — MIDAZOLAM HCL 2 MG/2ML IJ SOLN: 0.5000 mg | Freq: Once | INTRAMUSCULAR | Status: DC | PRN

## 2020-02-04 MED ORDER — FENTANYL CITRATE (PF) 250 MCG/5ML IJ SOLN
INTRAMUSCULAR | Status: AC
  Filled 2020-02-04: qty 5

## 2020-02-04 MED ORDER — SODIUM CHLORIDE 0.9% FLUSH: 9.0000 mL | INTRAVENOUS | Status: DC | PRN

## 2020-02-04 MED ORDER — PROMETHAZINE HCL 25 MG/ML IJ SOLN: 6.2500 mg | INTRAMUSCULAR | Status: DC | PRN

## 2020-02-04 MED ORDER — OXYCODONE HCL 5 MG/5ML PO SOLN: 5.0000 mg | Freq: Once | ORAL | Status: DC | PRN

## 2020-02-04 MED ORDER — ROCURONIUM BROMIDE 10 MG/ML (PF) SYRINGE
PREFILLED_SYRINGE | INTRAVENOUS | Status: DC | PRN
  Administered 2020-02-04: 100 mg via INTRAVENOUS

## 2020-02-04 MED ORDER — PROPOFOL 10 MG/ML IV BOLUS
INTRAVENOUS | Status: AC
  Filled 2020-02-04: qty 20

## 2020-02-04 MED ORDER — ACETAMINOPHEN 500 MG PO TABS
1000.0000 mg | ORAL_TABLET | Freq: Four times a day (QID) | ORAL | Status: AC
  Administered 2020-02-05 – 2020-02-09 (×13): 1000 mg via ORAL
  Filled 2020-02-04 (×12): qty 2

## 2020-02-04 MED ORDER — PHENYLEPHRINE HCL (PRESSORS) 10 MG/ML IV SOLN
INTRAVENOUS | Status: DC | PRN
  Administered 2020-02-04: 80 ug via INTRAVENOUS

## 2020-02-04 MED ORDER — 0.9 % SODIUM CHLORIDE (POUR BTL) OPTIME
TOPICAL | Status: DC | PRN
  Administered 2020-02-04: 2000 mL

## 2020-02-04 MED ORDER — ALBUTEROL SULFATE (2.5 MG/3ML) 0.083% IN NEBU
2.5000 mg | INHALATION_SOLUTION | RESPIRATORY_TRACT | Status: DC
  Administered 2020-02-04: 2.5 mg via RESPIRATORY_TRACT
  Filled 2020-02-04: qty 3

## 2020-02-04 MED ORDER — DIPHENHYDRAMINE HCL 50 MG/ML IJ SOLN: 12.5000 mg | Freq: Four times a day (QID) | INTRAMUSCULAR | Status: DC | PRN

## 2020-02-04 MED ORDER — ALBUMIN HUMAN 5 % IV SOLN: INTRAVENOUS | Status: DC | PRN

## 2020-02-04 MED ORDER — TRAMADOL HCL 50 MG PO TABS
50.0000 mg | ORAL_TABLET | Freq: Four times a day (QID) | ORAL | Status: DC | PRN
  Administered 2020-02-06 – 2020-02-14 (×8): 50 mg via ORAL
  Filled 2020-02-04 (×8): qty 1

## 2020-02-04 MED ORDER — ONDANSETRON HCL 4 MG/2ML IJ SOLN
INTRAMUSCULAR | Status: DC | PRN
  Administered 2020-02-04: 4 mg via INTRAVENOUS

## 2020-02-04 MED ORDER — OXYCODONE HCL 5 MG PO TABS: 5.0000 mg | ORAL_TABLET | Freq: Once | ORAL | Status: DC | PRN

## 2020-02-04 MED ORDER — DEXTROSE-NACL 5-0.9 % IV SOLN: INTRAVENOUS | Status: DC

## 2020-02-04 MED ORDER — 0.9 % SODIUM CHLORIDE (POUR BTL) OPTIME
TOPICAL | Status: DC | PRN
  Administered 2020-02-04: 1000 mL

## 2020-02-04 MED ORDER — SENNOSIDES-DOCUSATE SODIUM 8.6-50 MG PO TABS
1.0000 | ORAL_TABLET | Freq: Every day | ORAL | Status: DC
  Administered 2020-02-05 – 2020-02-13 (×10): 1 via ORAL
  Filled 2020-02-04 (×11): qty 1

## 2020-02-04 MED ORDER — HYDROMORPHONE HCL 1 MG/ML IJ SOLN
INTRAMUSCULAR | Status: AC
  Filled 2020-02-04: qty 1

## 2020-02-04 MED ORDER — ROCURONIUM BROMIDE 10 MG/ML (PF) SYRINGE
PREFILLED_SYRINGE | INTRAVENOUS | Status: AC
  Filled 2020-02-04: qty 10

## 2020-02-04 MED ORDER — MIDAZOLAM HCL 2 MG/2ML IJ SOLN
INTRAMUSCULAR | Status: DC | PRN
  Administered 2020-02-04: 2 mg via INTRAVENOUS

## 2020-02-04 MED ORDER — HYDROMORPHONE HCL 1 MG/ML IJ SOLN
0.2500 mg | INTRAMUSCULAR | Status: DC | PRN
  Administered 2020-02-04 (×2): 0.5 mg via INTRAVENOUS

## 2020-02-04 MED ORDER — METOCLOPRAMIDE HCL 5 MG/ML IJ SOLN
10.0000 mg | Freq: Four times a day (QID) | INTRAMUSCULAR | Status: AC
  Administered 2020-02-05 (×4): 10 mg via INTRAVENOUS
  Filled 2020-02-04 (×4): qty 2

## 2020-02-04 MED ORDER — EPHEDRINE 5 MG/ML INJ
INTRAVENOUS | Status: AC
  Filled 2020-02-04: qty 20

## 2020-02-04 MED ORDER — CHLORHEXIDINE GLUCONATE CLOTH 2 % EX PADS
6.0000 | MEDICATED_PAD | Freq: Every day | CUTANEOUS | Status: DC
  Administered 2020-02-04 – 2020-02-12 (×5): 6 via TOPICAL

## 2020-02-04 MED ORDER — ALBUTEROL SULFATE (2.5 MG/3ML) 0.083% IN NEBU: 2.5000 mg | INHALATION_SOLUTION | RESPIRATORY_TRACT | Status: DC

## 2020-02-04 MED ORDER — OXYCODONE HCL 5 MG PO TABS
5.0000 mg | ORAL_TABLET | ORAL | Status: DC | PRN
  Administered 2020-02-13 – 2020-02-16 (×9): 10 mg via ORAL
  Filled 2020-02-04 (×9): qty 2

## 2020-02-04 MED ORDER — MIDAZOLAM HCL 2 MG/2ML IJ SOLN
INTRAMUSCULAR | Status: AC
  Filled 2020-02-04: qty 2

## 2020-02-04 MED ORDER — LACTATED RINGERS IV SOLN: INTRAVENOUS | Status: DC | PRN

## 2020-02-04 MED ORDER — NALOXONE HCL 0.4 MG/ML IJ SOLN: 0.4000 mg | INTRAMUSCULAR | Status: DC | PRN

## 2020-02-04 MED ORDER — CHLORHEXIDINE GLUCONATE 0.12 % MT SOLN: 15.0000 mL | Freq: Once | OROMUCOSAL | Status: DC

## 2020-02-04 MED ORDER — FENTANYL CITRATE (PF) 250 MCG/5ML IJ SOLN
INTRAMUSCULAR | Status: DC | PRN
  Administered 2020-02-04: 100 ug via INTRAVENOUS
  Administered 2020-02-04 (×2): 50 ug via INTRAVENOUS

## 2020-02-04 MED ORDER — DEXAMETHASONE SODIUM PHOSPHATE 10 MG/ML IJ SOLN
INTRAMUSCULAR | Status: AC
  Filled 2020-02-04: qty 1

## 2020-02-04 MED ORDER — ONDANSETRON HCL 4 MG/2ML IJ SOLN: 4.0000 mg | Freq: Four times a day (QID) | INTRAMUSCULAR | Status: DC | PRN

## 2020-02-04 MED ORDER — DIPHENHYDRAMINE HCL 12.5 MG/5ML PO ELIX: 12.5000 mg | ORAL_SOLUTION | Freq: Four times a day (QID) | ORAL | Status: DC | PRN

## 2020-02-04 MED ORDER — ORAL CARE MOUTH RINSE: 15.0000 mL | Freq: Once | OROMUCOSAL | Status: DC

## 2020-02-04 MED ORDER — INSULIN ASPART 100 UNIT/ML ~~LOC~~ SOLN
0.0000 [IU] | SUBCUTANEOUS | Status: DC
  Administered 2020-02-04 – 2020-02-05 (×2): 2 [IU] via SUBCUTANEOUS
  Administered 2020-02-05: 4 [IU] via SUBCUTANEOUS
  Administered 2020-02-05: 8 [IU] via SUBCUTANEOUS
  Administered 2020-02-05 (×2): 2 [IU] via SUBCUTANEOUS
  Administered 2020-02-05: 4 [IU] via SUBCUTANEOUS
  Administered 2020-02-06 (×2): 2 [IU] via SUBCUTANEOUS

## 2020-02-04 MED ORDER — CHLORHEXIDINE GLUCONATE 0.12 % MT SOLN
OROMUCOSAL | Status: AC
  Filled 2020-02-04: qty 15

## 2020-02-04 MED ORDER — ONDANSETRON HCL 4 MG/2ML IJ SOLN
INTRAMUSCULAR | Status: AC
  Filled 2020-02-04: qty 2

## 2020-02-04 MED ORDER — CHLORHEXIDINE GLUCONATE 0.12 % MT SOLN
15.0000 mL | OROMUCOSAL | Status: AC
  Administered 2020-02-04: 15 mL via OROMUCOSAL

## 2020-02-04 MED ORDER — ACETAMINOPHEN 160 MG/5ML PO SOLN
1000.0000 mg | Freq: Four times a day (QID) | ORAL | Status: AC
  Administered 2020-02-05 – 2020-02-08 (×2): 1000 mg via ORAL
  Filled 2020-02-04 (×2): qty 40.6

## 2020-02-04 MED ORDER — MEPERIDINE HCL 25 MG/ML IJ SOLN: 6.2500 mg | INTRAMUSCULAR | Status: DC | PRN

## 2020-02-04 MED FILL — Morphine Sulfate IV Soln PF 10 MG/ML: INTRAVENOUS | Qty: 5 | Status: AC

## 2020-02-04 MED FILL — Sodium Chloride IV Soln 0.9%: INTRAVENOUS | Qty: 20 | Status: AC

## 2020-02-04 SURGICAL SUPPLY — 47 items
BLADE CLIPPER SURG (BLADE) ×2 IMPLANT
CANISTER SUCT 3000ML PPV (MISCELLANEOUS) ×2 IMPLANT
CATH THORACIC 28FR (CATHETERS) IMPLANT
CATH THORACIC 36FR (CATHETERS) ×2 IMPLANT
CATH THORACIC 36FR RT ANG (CATHETERS) ×2 IMPLANT
CLEANER TIP ELECTROSURG 2X2 (MISCELLANEOUS) ×2 IMPLANT
CNTNR URN SCR LID CUP LEK RST (MISCELLANEOUS) ×2 IMPLANT
CONT SPEC 4OZ STRL OR WHT (MISCELLANEOUS) ×2
DEFOGGER ANTIFOG KIT (MISCELLANEOUS) ×2 IMPLANT
DRAPE LAPAROSCOPIC ABDOMINAL (DRAPES) ×2 IMPLANT
DRAPE WARM FLUID 44X44 (DRAPES) ×2 IMPLANT
ELECT REM PT RETURN 9FT ADLT (ELECTROSURGICAL) ×2
ELECTRODE REM PT RTRN 9FT ADLT (ELECTROSURGICAL) ×1 IMPLANT
GAUZE SPONGE 4X4 12PLY STRL (GAUZE/BANDAGES/DRESSINGS) ×2 IMPLANT
GAUZE SPONGE 4X4 12PLY STRL LF (GAUZE/BANDAGES/DRESSINGS) ×2 IMPLANT
GLOVE BIO SURGEON STRL SZ 6.5 (GLOVE) ×6 IMPLANT
GLOVE SURG UNDER POLY LF SZ6 (GLOVE) ×2 IMPLANT
GLOVE TRIUMPH SURG SIZE 7.0 (KITS) ×4 IMPLANT
GOWN STRL REUS W/ TWL LRG LVL3 (GOWN DISPOSABLE) ×5 IMPLANT
GOWN STRL REUS W/ TWL XL LVL3 (GOWN DISPOSABLE) ×1 IMPLANT
GOWN STRL REUS W/TWL LRG LVL3 (GOWN DISPOSABLE) ×5
GOWN STRL REUS W/TWL XL LVL3 (GOWN DISPOSABLE) ×1
KIT BASIN OR (CUSTOM PROCEDURE TRAY) ×2 IMPLANT
KIT SUCTION CATH 14FR (SUCTIONS) ×2 IMPLANT
KIT TURNOVER KIT B (KITS) ×2 IMPLANT
NS IRRIG 1000ML POUR BTL (IV SOLUTION) ×10 IMPLANT
PACK CHEST (CUSTOM PROCEDURE TRAY) ×2 IMPLANT
PAD ARMBOARD 7.5X6 YLW CONV (MISCELLANEOUS) ×4 IMPLANT
STAPLER VISISTAT 35W (STAPLE) ×2 IMPLANT
SUT SILK  1 MH (SUTURE) ×4
SUT SILK 1 MH (SUTURE) ×4 IMPLANT
SUT VIC AB 1 CTX 36 (SUTURE) ×1
SUT VIC AB 1 CTX36XBRD ANBCTR (SUTURE) ×1 IMPLANT
SUT VIC AB 2 TP1 27 (SUTURE) ×2 IMPLANT
SUT VIC AB 2-0 CT1 27 (SUTURE) ×1
SUT VIC AB 2-0 CT1 TAPERPNT 27 (SUTURE) ×1 IMPLANT
SUT VIC AB 3-0 X1 27 (SUTURE) ×2 IMPLANT
SWAB COLLECTION DEVICE MRSA (MISCELLANEOUS) IMPLANT
SWAB CULTURE ESWAB REG 1ML (MISCELLANEOUS) IMPLANT
SYSTEM SAHARA CHEST DRAIN ATS (WOUND CARE) ×2 IMPLANT
TAPE CLOTH 4X10 WHT NS (GAUZE/BANDAGES/DRESSINGS) ×2 IMPLANT
TAPE CLOTH SURG 4X10 WHT LF (GAUZE/BANDAGES/DRESSINGS) ×2 IMPLANT
TOWEL GREEN STERILE (TOWEL DISPOSABLE) ×2 IMPLANT
TOWEL GREEN STERILE FF (TOWEL DISPOSABLE) ×2 IMPLANT
TRAP SPECIMEN MUCUS 40CC (MISCELLANEOUS) ×2 IMPLANT
TRAY FOLEY MTR SLVR 14FR STAT (SET/KITS/TRAYS/PACK) ×2 IMPLANT
WATER STERILE IRR 1000ML POUR (IV SOLUTION) ×4 IMPLANT

## 2020-02-04 NOTE — Brief Op Note (Signed)
02/02/2020 - 02/04/2020  5:36 PM  PATIENT:  Francisco Pittman  59 y.o. male  PRE-OPERATIVE DIAGNOSIS:  left empyema  POST-OPERATIVE DIAGNOSIS:  left empyema  PROCEDURE:  Procedure(s): LEFT THORACOTOMY, DRAINAGE OF EMPYEMA (Left)  SURGEON:  Surgeon(s) and Role:    * Kassidi Elza, Payton Doughty, MD - Primary  PHYSICIAN ASSISTANT: Erin Barrett, PA-C   ANESTHESIA:   general  EBL:  minimal  BLOOD ADMINISTERED:none  DRAINS: Two 36 F chest tubes in left pleural space   LOCAL MEDICATIONS USED:  NONE  SPECIMEN:  Source of Specimen:  left pleural fluid and fibrinous peel  DISPOSITION OF SPECIMEN:  micro  COUNTS:  YES  TOURNIQUET:  * No tourniquets in log *  DICTATION: .Note written in EPIC  PLAN OF CARE: Admit to inpatient   PATIENT DISPOSITION:  PACU - hemodynamically stable.   Delay start of Pharmacological VTE agent (>24hrs) due to surgical blood loss or risk of bleeding: yes

## 2020-02-04 NOTE — Progress Notes (Signed)
[facility] PROGRESS NOTE    Stancil Deisher  ZOX:[id number] DOB: 1960/05/03 DOA: 02/02/2020 PCP: Patient, No Pcp Per      Brief Narrative:  Mr. Larose is a 59 y.o. M with smoking, EtOH use who presents with non-resolving pain, cough, malaise after recent pneumonia.  Patient admitted to APS last month for multifocal pneumonia, treated with IV antibiotics, and discharged with oral antibiotics.  After completion of oral antibiotics, he continued to have severe left-sided pain, malaise, chills, and cough, and so he returned to the ER.  On readmission, CT of the chest showed a large loculated pleural effusion, progressed from previous.  Thoracentesis was obtained and about 200 cc of purulent foul-smelling fluid returned, which had polymicrobial Gram stain.  Case was discussed with CT surgery, who appear to have recommended transfer for VATS.          Assessment & Plan:  Empyema Patient with large loculated left lower lobe parapneumonic effusion, aspirate frankly purulent with positive Gram stain.  Recent hospitalization with IV antibiotics.  Afebrile overnight -Continue cefepime, vancomycin, Flagyl -Consult CT surgery -To the OR today for VATS decortication     Smoking Smoking cessation recommended, modalities discussed  Suspected COPD This is a likely although unconfirmed diagnosis.  Not his primary symptomology, although he is wheezing a little bit -Albuterol as needed  History of alcohol use No withdrawal -Continue folate and thiamine   Anemia, normocytic Likely from chronic inflammation Globin down from 1 today -Check iron studies, B12, reticulocyte       Disposition: Status is: Inpatient  Remains inpatient appropriate because:Ongoing diagnostic testing needed not appropriate for outpatient work up   Dispo: The patient is from: Home              Anticipated d/c is to: Home              Anticipated d/c date is: > 3 days               Patient currently is not medically stable to d/c.     Patient admitted with empyema.  VATS is planned today, then chest tubes.  PT evaluation after surgery, then chest tube removal, then either home or to SNF, chest tubes will need to be removed after that prior to discharge.  Likely a while until he is ready for discharge.         MDM: The below labs and imaging reports were reviewed and summarized above.  Medication management as above.    DVT prophylaxis: SCDs  Code Status: Full code Family Communication: Wife at the bedside    Consultants:   CT surgery  Procedures:   11/24 thoracentesis  11/26 VATS  Antimicrobials:   Vancomycin and cefepime 11/24>>  Flagyl 11/25>>  Culture data:   Blood cultures 1/24-no growth to date  Body fluid culture from effusion/thoracentesis 11/24-Gram stain positive culture still no growth          Subjective: Patient with some nausea and still some left-sided pain, pleuritic in character.  No fever overnight.  Moderate malaise.  Poor appetite.  No dyspnea, diarrhea.         Objective: Vitals:   02/03/20 2000 02/03/20 2358 02/04/20 0300 02/04/20 0700  BP:  122/66 (!) 115/58 116/63  Pulse: (!) 105 (!) 108 (!) 107 98  Resp: 20 20 20 18   Temp:  98.5 F (36.9 C) 100 F (37.8 C) 98.2 F (36.8 C)  TempSrc:  Oral Oral Oral  SpO2:  91% 93% 91%  Weight:      Height:        Intake/Output Summary (Last 24 hours) at 02/04/2020 1000 Last data filed at 02/04/2020 0750 Gross per 24 hour  Intake 785.67 ml  Output 985 ml  Net -199.33 ml   Filed Weights   02/02/20 1208 02/02/20 2231  Weight: 71.2 kg 68.4 kg    Examination: General appearance: Adult male, lying in bed, no obvious distress, interactive     HEENT: Anicteric, conjunctival pink, lids and lashes normal.  No nasal deformity, discharge, epistaxis, lips moist, dentition in poor repair, oropharynx moist, no oral lesions Skin: Warm and dry, no suspicious  rashes or lesions. Cardiac: RRR, no murmurs, no lower extremity edema Respiratory: Lung sounds diminished on the left, crackles on the left, normal respiratory rate and rhythm, Abdomen: Abdomen soft, no tenderness palpation or guarding. MSK:  Neuro: Awake and alert, extraocular was intact, moves all extremities with normal strength and coordination, speech fluent. Psych: Sensorium intact responding questions, attention normal, effort normal, judgment insight appear normal     Data Reviewed: I have personally reviewed following labs and imaging studies:  CBC: Recent Labs  Lab 02/02/20 1339 02/04/20 0027  WBC 28.0* 32.2*  NEUTROABS 23.8*  --   HGB 10.5* 8.8*  HCT 32.9* 27.1*  MCV 91.1 89.4  PLT 500* 559*   Basic Metabolic Panel: Recent Labs  Lab 02/02/20 1339 02/04/20 0027  NA 136 136  K 3.6 3.7  CL 102 108  CO2 23 19*  GLUCOSE 117* 106*  BUN 29* 21  CREATININE 1.11 0.95  CALCIUM 8.6* 7.9*   GFR: Estimated Creatinine Clearance: 76.3 mL/min (by C-G formula based on SCr of 0.95 mg/dL). Liver Function Tests: Recent Labs  Lab 02/02/20 1339 02/02/20 1903 02/04/20 0027  AST 28  --  35  ALT 17  --  31  ALKPHOS 140*  --  147*  BILITOT 1.0  --  0.9  PROT 7.4 6.2* 5.5*  ALBUMIN 2.6* 2.0* 1.6*   No results for input(s): LIPASE, AMYLASE in the last 168 hours. No results for input(s): AMMONIA in the last 168 hours. Coagulation Profile: Recent Labs  Lab 02/02/20 1903  INR 1.3*   Cardiac Enzymes: No results for input(s): CKTOTAL, CKMB, CKMBINDEX, TROPONINI in the last 168 hours. BNP (last 3 results) No results for input(s): PROBNP in the last 8760 hours. HbA1C: No results for input(s): HGBA1C in the last 72 hours. CBG: No results for input(s): GLUCAP in the last 168 hours. Lipid Profile: No results for input(s): CHOL, HDL, LDLCALC, TRIG, CHOLHDL, LDLDIRECT in the last 72 hours. Thyroid Function Tests: No results for input(s): TSH, T4TOTAL, FREET4, T3FREE,  THYROIDAB in the last 72 hours. Anemia Panel: No results for input(s): VITAMINB12, FOLATE, FERRITIN, TIBC, IRON, RETICCTPCT in the last 72 hours. Urine analysis:    Component Value Date/Time   COLORURINE YELLOW 02/02/2020 1640   APPEARANCEUR HAZY (A) 02/02/2020 1640   LABSPEC 1.020 02/02/2020 1640   PHURINE 5.0 02/02/2020 1640   GLUCOSEU NEGATIVE 02/02/2020 1640   HGBUR NEGATIVE 02/02/2020 1640   BILIRUBINUR NEGATIVE 02/02/2020 1640   KETONESUR 5 (A) 02/02/2020 1640   PROTEINUR 30 (A) 02/02/2020 1640   NITRITE NEGATIVE 02/02/2020 1640   LEUKOCYTESUR NEGATIVE 02/02/2020 1640   Sepsis Labs: @LABRCNTIP (procalcitonin:4,lacticacidven:4)  ) Recent Results (from the past 240 hour(s))  Resp Panel by RT-PCR (Flu A&B, Covid) Nasopharyngeal Swab     Status: None   Collection Time: 02/02/20  3:21 PM  Specimen: Nasopharyngeal Swab; Nasopharyngeal(NP) swabs in vial transport medium  Result Value Ref Range Status   SARS Coronavirus 2 by RT PCR NEGATIVE NEGATIVE Final    Comment: (NOTE) SARS-CoV-2 target nucleic acids are NOT DETECTED.  The SARS-CoV-2 RNA is generally detectable in upper respiratory specimens during the acute phase of infection. The lowest concentration of SARS-CoV-2 viral copies this assay can detect is 138 copies/mL. A negative result does not preclude SARS-Cov-2 infection and should not be used as the sole basis for treatment or other patient management decisions. A negative result may occur with  improper specimen collection/handling, submission of specimen other than nasopharyngeal swab, presence of viral mutation(s) within the areas targeted by this assay, and inadequate number of viral copies(<138 copies/mL). A negative result must be combined with clinical observations, patient history, and epidemiological information. The expected result is Negative.  Fact Sheet for Patients:  BloggerCourse.com  Fact Sheet for Healthcare Providers:   SeriousBroker.it  This test is no t yet approved or cleared by the [place] FDA and  has been authorized for detection and/or diagnosis of SARS-CoV-2 by FDA under an Emergency Use Authorization (EUA). This EUA will remain  in effect (meaning this test can be used) for the duration of the COVID-19 declaration under Section 564(b)(1) of the Act, 21 U.S.C.section 360bbb-3(b)(1), unless the authorization is terminated  or revoked sooner.       Influenza A by PCR NEGATIVE NEGATIVE Final   Influenza B by PCR NEGATIVE NEGATIVE Final    Comment: (NOTE) The Xpert Xpress SARS-CoV-2/FLU/RSV plus assay is intended as an aid in the diagnosis of influenza from Nasopharyngeal swab specimens and should not be used as a sole basis for treatment. Nasal washings and aspirates are unacceptable for Xpert Xpress SARS-CoV-2/FLU/RSV testing.  Fact Sheet for Patients: BloggerCourse.com  Fact Sheet for Healthcare Providers: SeriousBroker.it  This test is not yet approved or cleared by the [place] FDA and has been authorized for detection and/or diagnosis of SARS-CoV-2 by FDA under an Emergency Use Authorization (EUA). This EUA will remain in effect (meaning this test can be used) for the duration of the COVID-19 declaration under Section 564(b)(1) of the Act, 21 U.S.C. section 360bbb-3(b)(1), unless the authorization is terminated or revoked.  Performed at [facility], [address], [place], [place] [address]   Blood Culture (routine x 2)     Status: None (Preliminary result)   Collection Time: 02/02/20  3:41 PM   Specimen: BLOOD  Result Value Ref Range Status   Specimen Description BLOOD  Final   Special Requests NONE  Final   Culture   Final    NO GROWTH 2 DAYS Performed at [facility], [address], [place], [place] [address]    Report Status PENDING  Incomplete  Blood Culture (routine x 2)     Status:  None (Preliminary result)   Collection Time: 02/02/20  3:41 PM   Specimen: BLOOD  Result Value Ref Range Status   Specimen Description BLOOD LEFT ANTECUBITAL  Final   Special Requests   Final    BOTTLES DRAWN AEROBIC AND ANAEROBIC Blood Culture adequate volume   Culture   Final    NO GROWTH 2 DAYS Performed at [facility], [address] [place], [place] [address]    Report Status PENDING  Incomplete  Urine culture     Status: None   Collection Time: 02/02/20  4:40 PM   Specimen: Urine, Clean Catch  Result Value Ref Range Status  Specimen Description   Final    URINE, CLEAN CATCH Performed at [facility], [address], [place], [place] [address]    Special Requests   Final    NONE Performed at [facility], [address], [place], [place] [address]    Culture   Final    NO GROWTH Performed at [facility], [address], [place], [place] [address]    Report Status 02/04/2020 FINAL  Final  Culture, body fluid-bottle     Status: None (Preliminary result)   Collection Time: 02/02/20  5:40 PM   Specimen: Pleura  Result Value Ref Range Status   Specimen Description PLEURAL  Final   Special Requests BOTTLES DRAWN AEROBIC AND ANAEROBIC 10CC  Final   Gram Stain   Final    GRAM POSITIVE COCCI IN BOTH AEROBIC AND ANAEROBIC BOTTLES Gram Stain Report Called to,Read Back By and Verified With: Kennon Portela  02/02/20 MKELLY    Culture   Final    NO GROWTH 2 DAYS Performed at [facility], [address], [place], [place] [address]    Report Status PENDING  Incomplete  Gram stain     Status: None   Collection Time: 02/02/20  5:40 PM   Specimen: Pleura  Result Value Ref Range Status   Specimen Description PLEURAL  Final   Special Requests NONE  Final   Gram Stain   Final    ABUNDANT GRAM POSITIVE COCCI RARE GRAM POSITIVE RODS WBC PRESENT, PREDOMINANTLY MONONUCLEAR Gram Stain Report Called to,Read Back By and Verified With: TONI [email or web address]  02/02/20 BY  JONES,T Performed at [facility], [address] [place], [place] [address]    Report Status 02/02/2020 FINAL  Final  Surgical pcr screen     Status: None   Collection Time: 02/02/20 10:39 PM   Specimen: Nasal Mucosa; Nasal Swab  Result Value Ref Range Status   MRSA, PCR NEGATIVE NEGATIVE Final   Staphylococcus aureus NEGATIVE NEGATIVE Final    Comment: (NOTE) The Xpert SA Assay (FDA approved for NASAL specimens in patients 83 years of age and older), is one component of a comprehensive surveillance program. It is not intended to diagnose infection nor to guide or monitor treatment. Performed at [facility], [address] [place], [place] [address]          Radiology Studies: CT ANGIO CHEST PE W OR WO CONTRAST  Result Date: 02/02/2020 CLINICAL DATA:  Chest pain and pleurisy. Shortness of breath over the last several weeks. EXAM: CT ANGIOGRAPHY CHEST WITH CONTRAST TECHNIQUE: Multidetector CT imaging of the chest was performed using the standard protocol during bolus administration of intravenous contrast. Multiplanar CT image reconstructions and MIPs were obtained to evaluate the vascular anatomy. CONTRAST:  OMNIPAQUE IOHEXOL 350 MG/ML SOLN COMPARISON:  Chest radiography same day.  Chest CT 11/06/2019. FINDINGS: Cardiovascular: Heart size is normal. No pericardial effusion. Aortic atherosclerosis is present without evidence of aneurysm or dissection. No pulmonary emboli are seen. Mediastinum/Nodes: No mediastinal or hilar mass or lymphadenopathy, other than mild reactive nodal prominence. Lungs/Pleura: Background emphysema. In the right chest, previously seen irregular infiltrate in the right upper lobe is resolving. There does remain some spiculated density in this region. Further follow-up of this is suggested to ensure complete clearing. On the left, patient has multiple loculated pleural fluid collections with air-fluid levels consistent with empyema. This is quite  extensive. There is collapse of the left lower lobe. There may be a peripheral left lower lobe  abscess. Spiculated density in the left upper lobe presumed related infiltrate previously is improving. There is some compressive atelectasis of the left upper lobe due to pleural collections as well. Upper Abdomen: Negative Musculoskeletal: Old partial compression fracture of T12. No acute bone finding. Review of the MIP images confirms the above findings. IMPRESSION: 1. Development of multiple loculated pleural fluid collections on the left consistent with extensive and widespread empyema formation. 2. Possible peripheral pulmonary abscess in the left lower lobe. 3. Marked compressive volume loss of the left lung. 4. Improving areas of infiltrate in the right upper lobe and left lower lobe compared to the previous study. Additional follow-up suggested to ensure complete clearing. 5. Background emphysema. 6. Aortic atherosclerosis. Aortic Atherosclerosis (ICD10-I70.0) and Emphysema (ICD10-J43.9). Electronically Signed   By: Paulina Fusi M.D.   On: 02/02/2020 19:21   DG Chest Port 1 View  Result Date: 02/02/2020 CLINICAL DATA:  LEFT pleural effusion post thoracentesis; aspirated fluid grossly purulent and foul-smelling consistent with empyema EXAM: PORTABLE CHEST 1 VIEW COMPARISON:  Portable exam 1803 hours compared to 1306 hours FINDINGS: Upper normal heart size. Complicated partially loculated LEFT pleural fluid collection with significant atelectasis and consolidation. Appearance unchanged from earlier study. No pneumothorax. Atelectasis versus infiltrate or scarring in the RIGHT upper lobe unchanged. IMPRESSION: No pneumothorax following LEFT thoracentesis. Persistent complicated LEFT pleural effusion with atelectasis and/or consolidation in LEFT lung. Electronically Signed   By: Ulyses Southward M.D.   On: 02/02/2020 18:10   DG Chest Port 1 View  Result Date: 02/02/2020 CLINICAL DATA:  Shortness of breath EXAM:  PORTABLE CHEST 1 VIEW COMPARISON:  January 06, 2020 FINDINGS: There is a moderate pleural effusion on the left. There is extensive airspace opacity throughout much of the left lung consistent with extensive progression of pneumonia since 1 month prior. There is ill-defined opacity in the right upper lobe, less prominent than on previous study. Heart size and pulmonary vascularity are normal. No adenopathy. No bone lesions. IMPRESSION: Widespread airspace opacity throughout the left lung consistent with extensive progression of pneumonia. Pleural effusion also noted on the left, moderate in size. Less infiltrate right upper lobe compared to previous study. Right lung otherwise clear. Grossly stable cardiac silhouette. Electronically Signed   By: Bretta Bang III M.D.   On: 02/02/2020 13:19   ECHOCARDIOGRAM COMPLETE  Result Date: 02/03/2020    ECHOCARDIOGRAM REPORT   Patient Name:   HISASHI Showers Date of Exam: 02/03/2020 Medical Rec #:  [id number]       Height:       67.0 in Accession #:    [id number]      Weight:       150.7 lb Date of Birth:  [birth year]       BSA:          1.793 m Patient Age:    59 years        BP:           129/63 mmHg Patient Gender: M               HR:           105 bpm. Exam Location:  Inpatient Procedure: 2D Echo, Cardiac Doppler and Color Doppler Indications:    R06.02 SOB  History:        Patient has no prior history of Echocardiogram examinations.                 PNA. coughing.  Sonographer:    Roosvelt Maser  RDCS Referring Phys: [id number] COURAGE EMOKPAE IMPRESSIONS  1. Left ventricular ejection fraction, by estimation, is 60 to 65%. The left ventricle has normal function. The left ventricle has no regional wall motion abnormalities. Left ventricular diastolic parameters are consistent with Grade I diastolic dysfunction (impaired relaxation).  2. Right ventricular systolic function is normal. The right ventricular size is normal.  3. The mitral valve is normal in structure. No  evidence of mitral valve regurgitation. No evidence of mitral stenosis.  4. The aortic valve is normal in structure. Aortic valve regurgitation is not visualized. No aortic stenosis is present.  5. The inferior vena cava is dilated in size with >50% respiratory variability, suggesting right atrial pressure of 8 mmHg. FINDINGS  Left Ventricle: Left ventricular ejection fraction, by estimation, is 60 to 65%. The left ventricle has normal function. The left ventricle has no regional wall motion abnormalities. The left ventricular internal cavity size was normal in size. There is  no left ventricular hypertrophy. Left ventricular diastolic parameters are consistent with Grade I diastolic dysfunction (impaired relaxation). Right Ventricle: The right ventricular size is normal. No increase in right ventricular wall thickness. Right ventricular systolic function is normal. Left Atrium: Left atrial size was normal in size. Right Atrium: Right atrial size was normal in size. Pericardium: There is no evidence of pericardial effusion. Mitral Valve: The mitral valve is normal in structure. No evidence of mitral valve regurgitation. No evidence of mitral valve stenosis. Tricuspid Valve: The tricuspid valve is normal in structure. Tricuspid valve regurgitation is not demonstrated. No evidence of tricuspid stenosis. Aortic Valve: The aortic valve is normal in structure. Aortic valve regurgitation is not visualized. No aortic stenosis is present. Pulmonic Valve: The pulmonic valve was normal in structure. Pulmonic valve regurgitation is mild. No evidence of pulmonic stenosis. Aorta: The aortic root is normal in size and structure. Venous: The inferior vena cava is dilated in size with greater than 50% respiratory variability, suggesting right atrial pressure of 8 mmHg. IAS/Shunts: No atrial level shunt detected by color flow Doppler. Additional Comments: There is a small pleural effusion in the left lateral region.  LEFT VENTRICLE  PLAX 2D LVIDd:         4.60 cm      Diastology LVIDs:         3.20 cm      LV e' medial:    10.20 cm/s LV PW:         0.90 cm      LV E/e' medial:  9.3 LV IVS:        1.00 cm      LV e' lateral:   12.60 cm/s LVOT diam:     2.00 cm      LV E/e' lateral: 7.5 LV SV:         63 LV SV Index:   35 LVOT Area:     3.14 cm  LV Volumes (MOD) LV vol d, MOD A4C: 116.0 ml LV vol s, MOD A4C: 35.8 ml LV SV MOD A4C:     116.0 ml RIGHT VENTRICLE          IVC RV Basal diam:  3.90 cm  IVC diam: 2.20 cm LEFT ATRIUM           Index       RIGHT ATRIUM           Index LA diam:      3.70 cm 2.06 cm/m  RA Area:     19.90  cm LA Vol (A4C): 62.0 ml 34.58 ml/m RA Volume:   56.80 ml  31.68 ml/m  AORTIC VALVE LVOT Vmax:   145.00 cm/s LVOT Vmean:  87.900 cm/s LVOT VTI:    0.202 m  AORTA Ao Root diam: 2.90 cm Ao Asc diam:  2.80 cm MITRAL VALVE MV Area (PHT): 5.16 cm    SHUNTS MV Decel Time: 147 msec    Systemic VTI:  0.20 m MV E velocity: 95.10 cm/s  Systemic Diam: 2.00 cm MV A velocity: 82.70 cm/s MV E/A ratio:  1.15 Donato Schultz MD Electronically signed by Donato Schultz MD Signature Date/Time: 02/03/2020/1:23:38 PM    Final    US THORACENTESIS ASP PLEURAL SPACE W/IMG GUIDE  Result Date: 02/02/2020 INDICATION: Recent pneumonia, still not feeling well despite treatment EXAM: ULTRASOUND GUIDED DIAGNOSTIC LEFT THORACENTESIS MEDICATIONS: None. COMPLICATIONS: None immediate. PROCEDURE: An ultrasound guided thoracentesis was thoroughly discussed with the patient and questions answered. The benefits, risks, alternatives and complications were also discussed. The patient understands and wishes to proceed with the procedure. Written consent was obtained. Ultrasound was performed to localize and mark an adequate pocket of fluid in the LEFT chest. Observed LEFT pleural fluid collection is markedly complex, containing scattered echogenic foci question air, complex heterogeneous fluid, and markedly loculated in appearance. The area was then prepped and  draped in the normal sterile fashion. 1% Lidocaine was used for local anesthesia. Under ultrasound guidance a 8 French thoracentesis catheter was introduced. Thoracentesis was performed. The catheter was removed and a dressing applied. FINDINGS: A total of approximately 180 mL of grossly purulent and foul smelling fluid was removed. Samples were sent to the laboratory as requested by the clinical team. IMPRESSION: Successful ultrasound guided diagnostic LEFT thoracentesis yielding 180 mL of pleural fluid as above. Sonographic findings in appearance of the fluid are consistent with LEFT empyema. Electronically Signed   By: Ulyses Southward M.D.   On: 02/02/2020 18:07        Scheduled Meds: . sodium chloride   Intravenous Once  . folic acid  1 mg Oral Daily  . guaiFENesin  600 mg Oral BID  . metroNIDAZOLE  500 mg Oral Q8H  . multivitamin with minerals  1 tablet Oral Daily  . nicotine  14 mg Transdermal Daily  . thiamine  100 mg Oral Daily   Continuous Infusions: . ceFEPime (MAXIPIME) IV 2 g (02/04/20 0542)  . vancomycin 750 mg (02/04/20 0352)     LOS: 2 days    Time spent: 25 minutes    Alberteen Sam, MD [facility] 02/04/2020, 10:00 AM     Please page though AMION or Epic secure chat:  For [employer], Higher education careers adviser

## 2020-02-04 NOTE — Transfer of Care (Signed)
Immediate Anesthesia Transfer of Care Note  Patient: Francisco Pittman  Procedure(s) Performed: LEFT THORACOTOMY, DRAINAGE OF EMPYEMA, CHEST TUBE INSERTION (Left Chest) CHEST TUBE INSERTION (Left Chest)  Patient Location: PACU  Anesthesia Type:General  Level of Consciousness: awake, alert , oriented and patient cooperative  Airway & Oxygen Therapy: Patient Spontanous Breathing and Patient connected to face mask oxygen  Post-op Assessment: Report given to RN, Post -op Vital signs reviewed and stable and Patient moving all extremities X 4  Post vital signs: Reviewed and stable  Last Vitals:  Vitals Value Taken Time  BP 128/94 02/04/20 1751  Temp    Pulse 103 02/04/20 1758  Resp 26 02/04/20 1758  SpO2 75 % 02/04/20 1758  Vitals shown include unvalidated device data.  Last Pain:  Vitals:   02/04/20 1750  TempSrc:   PainSc: (P) 0-No pain      Patients Stated Pain Goal: 0 (02/04/20 0300)  Complications: No complications documented.

## 2020-02-04 NOTE — Anesthesia Procedure Notes (Signed)
Procedure Name: Intubation Date/Time: 02/04/2020 3:38 PM Performed by: Claris Che, CRNA Pre-anesthesia Checklist: Patient identified, Emergency Drugs available, Suction available, Patient being monitored and Timeout performed Patient Re-evaluated:Patient Re-evaluated prior to induction Oxygen Delivery Method: Circle system utilized Preoxygenation: Pre-oxygenation with 100% oxygen Induction Type: IV induction and Cricoid Pressure applied Ventilation: Mask ventilation without difficulty Laryngoscope Size: Mac and 4 Grade View: Grade II Endobronchial tube: Double lumen EBT, EBT position confirmed by fiberoptic bronchoscope and EBT position confirmed by auscultation and 39 Fr Number of attempts: 1 Airway Equipment and Method: Stylet Placement Confirmation: ETT inserted through vocal cords under direct vision,  positive ETCO2 and breath sounds checked- equal and bilateral Secured at: 39 cm Tube secured with: Tape Dental Injury: Teeth and Oropharynx as per pre-operative assessment

## 2020-02-04 NOTE — Anesthesia Preprocedure Evaluation (Addendum)
Anesthesia Evaluation  Patient identified by MRN, date of birth, ID band Patient awake    Reviewed: Allergy & Precautions, NPO status   Airway Mallampati: I  TM Distance: >3 FB Neck ROM: Full    Dental   Pulmonary Current Smoker,    Pulmonary exam normal        Cardiovascular Normal cardiovascular exam     Neuro/Psych    GI/Hepatic   Endo/Other    Renal/GU      Musculoskeletal   Abdominal   Peds  Hematology   Anesthesia Other Findings   Reproductive/Obstetrics                            Anesthesia Physical Anesthesia Plan  ASA: III  Anesthesia Plan: General   Post-op Pain Management:    Induction: Intravenous  PONV Risk Score and Plan: 1 and Ondansetron and Midazolam  Airway Management Planned: Double Lumen EBT  Additional Equipment: Arterial line  Intra-op Plan:   Post-operative Plan: Extubation in OR  Informed Consent: I have reviewed the patients History and Physical, chart, labs and discussed the procedure including the risks, benefits and alternatives for the proposed anesthesia with the patient or authorized representative who has indicated his/her understanding and acceptance.       Plan Discussed with: Surgeon and CRNA  Anesthesia Plan Comments:         Anesthesia Quick Evaluation

## 2020-02-04 NOTE — Anesthesia Procedure Notes (Signed)
Arterial Line Insertion Start/End11/26/2021 2:50 PM, 02/04/2020 3:00 PM Performed by: Melina Schools, CRNA, CRNA  Patient location: Pre-op. Preanesthetic checklist: patient identified, IV checked and pre-op evaluation Lidocaine 1% used for infiltration Right, radial was placed Catheter size: 20 G Hand hygiene performed  and maximum sterile barriers used   Attempts: 1 Procedure performed without using ultrasound guided technique. Following insertion, Biopatch and dressing applied. Patient tolerated the procedure well with no immediate complications.

## 2020-02-05 ENCOUNTER — Inpatient Hospital Stay (HOSPITAL_COMMUNITY): Payer: Medicaid - Out of State

## 2020-02-05 DIAGNOSIS — L8990 Pressure ulcer of unspecified site, unspecified stage: Secondary | ICD-10-CM | POA: Diagnosis present

## 2020-02-05 DIAGNOSIS — R6520 Severe sepsis without septic shock: Secondary | ICD-10-CM | POA: Diagnosis not present

## 2020-02-05 DIAGNOSIS — A419 Sepsis, unspecified organism: Secondary | ICD-10-CM | POA: Diagnosis not present

## 2020-02-05 LAB — IRON AND TIBC
Iron: 14 ug/dL — ABNORMAL LOW (ref 45–182)
Saturation Ratios: 14 % — ABNORMAL LOW (ref 17.9–39.5)
TIBC: 104 ug/dL — ABNORMAL LOW (ref 250–450)
UIBC: 90 ug/dL

## 2020-02-05 LAB — BASIC METABOLIC PANEL
Anion gap: 9 (ref 5–15)
BUN: 25 mg/dL — ABNORMAL HIGH (ref 8–23)
CO2: 19 mmol/L — ABNORMAL LOW (ref 22–32)
Calcium: 8.1 mg/dL — ABNORMAL LOW (ref 8.9–10.3)
Chloride: 110 mmol/L (ref 98–111)
Creatinine, Ser: 0.68 mg/dL (ref 0.61–1.24)
GFR, Estimated: >60 mL/min (ref >60)
Glucose, Bld: 171 mg/dL — ABNORMAL HIGH (ref 70–99)
Potassium: 3.6 mmol/L (ref 3.5–5.1)
Sodium: 138 mmol/L (ref 135–145)

## 2020-02-05 LAB — POCT I-STAT 7, (LYTES, BLD GAS, ICA,H+H)
Acid-base deficit: 5 mmol/L — ABNORMAL HIGH (ref 0.0–2.0)
Bicarbonate: 20.5 mmol/L (ref 20.0–28.0)
Calcium, Ion: 1.27 mmol/L (ref 1.15–1.40)
HCT: 27 % — ABNORMAL LOW (ref 39.0–52.0)
Hemoglobin: 9.2 g/dL — ABNORMAL LOW (ref 13.0–17.0)
O2 Saturation: 95 %
Patient temperature: 94.2
Potassium: 3.4 mmol/L — ABNORMAL LOW (ref 3.5–5.1)
Sodium: 142 mmol/L (ref 135–145)
TCO2: 22 mmol/L (ref 22–32)
pCO2 arterial: 34.5 mmHg (ref 32.0–48.0)
pH, Arterial: 7.371 (ref 7.350–7.450)
pO2, Arterial: 70 mmHg — ABNORMAL LOW (ref 83.0–108.0)

## 2020-02-05 LAB — CBC
HCT: 28.3 % — ABNORMAL LOW (ref 39.0–52.0)
Hemoglobin: 8.9 g/dL — ABNORMAL LOW (ref 13.0–17.0)
MCH: 29 pg (ref 26.0–34.0)
MCHC: 31.4 g/dL (ref 30.0–36.0)
MCV: 92.2 fL (ref 80.0–100.0)
Platelets: 548 10*3/uL — ABNORMAL HIGH (ref 150–400)
RBC: 3.07 MIL/uL — ABNORMAL LOW (ref 4.22–5.81)
RDW: 14.3 % (ref 11.5–15.5)
WBC: 29.8 10*3/uL — ABNORMAL HIGH (ref 4.0–10.5)
nRBC: 0 % (ref 0.0–0.2)

## 2020-02-05 LAB — GLUCOSE, CAPILLARY
Glucose-Capillary: 133 mg/dL — ABNORMAL HIGH (ref 70–99)
Glucose-Capillary: 142 mg/dL — ABNORMAL HIGH (ref 70–99)
Glucose-Capillary: 144 mg/dL — ABNORMAL HIGH (ref 70–99)
Glucose-Capillary: 150 mg/dL — ABNORMAL HIGH (ref 70–99)
Glucose-Capillary: 154 mg/dL — ABNORMAL HIGH (ref 70–99)
Glucose-Capillary: 167 mg/dL — ABNORMAL HIGH (ref 70–99)
Glucose-Capillary: 174 mg/dL — ABNORMAL HIGH (ref 70–99)
Glucose-Capillary: 210 mg/dL — ABNORMAL HIGH (ref 70–99)

## 2020-02-05 LAB — RETICULOCYTES
Immature Retic Fract: 16.8 % — ABNORMAL HIGH (ref 2.3–15.9)
RBC.: 3.13 MIL/uL — ABNORMAL LOW (ref 4.22–5.81)
Retic Count, Absolute: 32.6 10*3/uL (ref 19.0–186.0)
Retic Ct Pct: 1 % (ref 0.4–3.1)

## 2020-02-05 LAB — CULTURE, BODY FLUID W GRAM STAIN -BOTTLE

## 2020-02-05 LAB — VITAMIN B12: Vitamin B-12: 477 pg/mL (ref 180–914)

## 2020-02-05 MED ORDER — SODIUM CHLORIDE 0.9 % IV SOLN
1.0000 g | INTRAVENOUS | Status: DC
  Administered 2020-02-05 – 2020-02-06 (×2): 1 g via INTRAVENOUS
  Filled 2020-02-05 (×2): qty 10

## 2020-02-05 MED ORDER — POTASSIUM CHLORIDE 20 MEQ/15ML (10%) PO SOLN: 20.0000 meq | ORAL | Status: DC

## 2020-02-05 MED ORDER — ORAL CARE MOUTH RINSE
15.0000 mL | Freq: Two times a day (BID) | OROMUCOSAL | Status: DC
  Administered 2020-02-05 – 2020-02-16 (×11): 15 mL via OROMUCOSAL

## 2020-02-05 MED ORDER — SODIUM BICARBONATE 8.4 % IV SOLN
50.0000 meq | Freq: Once | INTRAVENOUS | Status: AC
  Administered 2020-02-05: 50 meq via INTRAVENOUS
  Filled 2020-02-05: qty 50

## 2020-02-05 MED ORDER — POTASSIUM CHLORIDE 20 MEQ/15ML (10%) PO SOLN
20.0000 meq | ORAL | Status: AC
  Administered 2020-02-05 (×3): 20 meq via ORAL
  Filled 2020-02-05 (×4): qty 15

## 2020-02-05 MED ORDER — ALBUTEROL SULFATE (2.5 MG/3ML) 0.083% IN NEBU: 2.5000 mg | INHALATION_SOLUTION | RESPIRATORY_TRACT | Status: DC | PRN

## 2020-02-05 MED ORDER — ALBUTEROL SULFATE (2.5 MG/3ML) 0.083% IN NEBU
2.5000 mg | INHALATION_SOLUTION | Freq: Three times a day (TID) | RESPIRATORY_TRACT | Status: DC
  Filled 2020-02-05: qty 3

## 2020-02-05 MED ORDER — LEVALBUTEROL HCL 0.63 MG/3ML IN NEBU
0.6300 mg | INHALATION_SOLUTION | Freq: Four times a day (QID) | RESPIRATORY_TRACT | Status: DC
  Administered 2020-02-05 – 2020-02-06 (×6): 0.63 mg via RESPIRATORY_TRACT
  Filled 2020-02-05 (×6): qty 3

## 2020-02-05 NOTE — Progress Notes (Signed)
1 Day Post-Op Procedure(s) (LRB): LEFT THORACOTOMY, DRAINAGE OF EMPYEMA, CHEST TUBE INSERTION (Left) CHEST TUBE INSERTION (Left) Subjective: No complaints  Objective: Vital signs in last 24 hours: Temp:  [94.2 F (34.6 C)-98.4 F (36.9 C)] 94.2 F (34.6 C) (11/27 0400) Pulse Rate:  [66-106] 66 (11/27 0700) Cardiac Rhythm: Normal sinus rhythm (11/27 0400) Resp:  [10-27] 10 (11/27 0700) BP: (88-128)/(53-94) 88/57 (11/27 0700) SpO2:  [90 %-97 %] 97 % (11/27 0700) Arterial Line BP: (95-166)/(46-69) 101/50 (11/27 0700) Weight:  [69.5 kg] 69.5 kg (11/27 0500)  Hemodynamic parameters for last 24 hours:    Intake/Output from previous day: 11/26 0701 - 11/27 0700 In: 4042.9 [P.O.:360; I.V.:2657.2; IV Piggyback:1025.7] Out: 1755 [Urine:1115; Blood:50; Chest Tube:590] Intake/Output this shift: No intake/output data recorded.  General appearance: alert and cooperative Neurologic: intact Heart: regular rate and rhythm, S1, S2 normal, no murmur, click, rub or gallop Lungs: clear to auscultation bilaterally Extremities: extremities normal, atraumatic, no cyanosis or edema Wound: dressed, dry  Lab Results: Recent Labs    02/04/20 0027 02/04/20 0027 02/05/20 0454 02/05/20 0457  WBC 32.2*  --  29.8*  --   HGB 8.8*   < > 8.9* 9.2*  HCT 27.1*   < > 28.3* 27.0*  PLT 559*  --  548*  --    < > = values in this interval not displayed.   BMET:  Recent Labs    02/04/20 0027 02/04/20 0027 02/05/20 0454 02/05/20 0457  NA 136   < > 138 142  K 3.7   < > 3.6 3.4*  CL 108  --  110  --   CO2 19*  --  19*  --   GLUCOSE 106*  --  171*  --   BUN 21  --  25*  --   CREATININE 0.95  --  0.68  --   CALCIUM 7.9*  --  8.1*  --    < > = values in this interval not displayed.    PT/INR:  Recent Labs    02/02/20 1903  LABPROT 15.9*  INR 1.3*   ABG    Component Value Date/Time   PHART 7.371 02/05/2020 0457   HCO3 20.5 02/05/2020 0457   TCO2 22 02/05/2020 0457   ACIDBASEDEF 5.0 (H)  02/05/2020 0457   O2SAT 95.0 02/05/2020 0457   CBG (last 3)  Recent Labs    02/05/20 0046 02/05/20 0424 02/05/20 0653  GLUCAP 144* 154* 150*    Assessment/Plan: S/P Procedure(s) (LRB): LEFT THORACOTOMY, DRAINAGE OF EMPYEMA, CHEST TUBE INSERTION (Left) CHEST TUBE INSERTION (Left) Mobilize pain control  txfer to 2C   LOS: 3 days    Linden Dolin 02/05/2020

## 2020-02-05 NOTE — Progress Notes (Deleted)
Complained  Of nauseousness after eating lunch. Tigan im given MD aware. Continue to monitor.

## 2020-02-05 NOTE — Op Note (Addendum)
02/04/2020 Francisco Pittman [id number]  Surgeon: Alleen Borne, MD   First Assistant: Lowella Dandy, PA-C  Preoperative Diagnosis: Left empyema   Postoperative Diagnosis: Left empyema   Procedure:  1. Left thoracotomy  2. Drainage of empyema  3. Decortication of the left lung   Anesthesia: General Endotracheal   Clinical History/Surgical Indication:   This 59 year old gentleman has multifocal bilateral pneumonia with development of a left empyema.  The right upper lobe infiltrate appears improved compared to his initial CT scan on 01/06/2020 but he has subsequently developed a large loculated left pleural effusion with compressive atelectasis of the left lung and unresolved pneumonia.  His white blood cell count was 28,000 on admission but he remains afebrile.  The pleural fluid was grossly purulent with Gram stain showing gram-positive cocci and gram-positive rods.  He will require left thoracotomy for drainage of the empyema and decortication of the lung.  He should remain on broad-spectrum intravenous antibiotics and I will plan to do surgery tomorrow.  I discussed the operative procedure of left thoracotomy and drainage of empyema with the patient including alternatives, benefits, and risk including but not limited to bleeding, blood transfusion, persistent infection, lung abscess, pleural space complications, respiratory failure requiring prolonged intubation, and he understands agrees to proceed.   Preparation:  The patient was seen in the preoperative holding area and the correct patient, correct operation, correct operative sidewere confirmed with the patient after reviewing the medical record and CT scan. The consent was signed by me. Preoperative antibiotics were given. The left side of the chest was signed by me. The patient was taken back to the operating room and positioned supine on the operating room table. After being placed under general endotracheal anesthesia by the  anesthesia team using a double lumen tube a foley catheter was placed. The patient was turned into the right lateral decubitus position. The chest was prepped with betadine soap and solution. A surgical time-out was taken and the correct patient,operative side, and operative procedure were confirmed with the nursing and anesthesia staff.   Operative Procedure:   A muscle sparing lateral thoracotomy incision was made and the chest entered through the 7th ICS. The pleural space was filled with a foul-smelling yellowish green multiloculated empyema that was purulent with a thick fibrinous peel over the lung. The empyema was completely drained and the left lung decorticated. This allowed complete expansion of the lung. The chest was irrigated with warm saline. Hemostasis was complete. Two 36 F chest tubes were placed through separate stab incisions and were positioned inferiorly over the diaphragm and posteriorly up to the apex in the pleural space. The ribs were reapproximated with # 2 vicryl pericostal sutures and the muscles returned to their normal anatomic postion. The subcutaneous tissue was closed with 2-0 vicryl continuous suture. The skin was closed with staples. All sponge, needle, and instrument counts were reported correct at the end of the case. Dry sterile dressings were placed over the incisions and around the chest tubes which were connected to pleurevac suction. The patient was turned supine, extubated,then transported to the PACU in satisfactory and stable condition.

## 2020-02-05 NOTE — Progress Notes (Signed)
[facility] PROGRESS NOTE    Francisco Pittman  QHU:[id number] DOB: [birth year] DOA: 02/02/2020 PCP: Patient, No Pcp Per      Brief Narrative:  Francisco Pittman is a 59 y.o. M with smoking, EtOH use who presents with non-resolving pain, cough, malaise after recent pneumonia.  Patient admitted to APS last month for multifocal pneumonia, treated with IV antibiotics, and discharged with oral antibiotics.  After completion of oral antibiotics, he continued to have severe left-sided pain, malaise, chills, and cough, and so he returned to the ER.  On readmission, CT of the chest showed a large loculated pleural effusion, progressed from previous.  Thoracentesis was obtained and about 200 cc of purulent foul-smelling fluid returned, which had polymicrobial Gram stain.  Case was discussed with CT surgery, who appear to have recommended transfer for VATS.          Assessment & Plan:  Empyema Status post VATS 11/26 by Dr. Lavinia Sharps Patient with large loculated left lower lobe parapneumonic effusion, aspirate frankly purulent with positive Gram stain.  Recent hospitalization with IV antibiotics.  VATS yesterday afternoon, chest tubes in place, breathing feels better, afebrile.  Culture from thoracentesis growing Streptococcus constellatus -Afebrile overnight -Narrow to ceftriaxone  -Consult CT surgery, appreciate recommendations -Chest tube management per CT surgery     Smoking Smoking cessation recommended, modalities discussed  Suspected COPD This is a likely although unconfirmed diagnosis.  Not his primary symptomology Still wheezing today -Albuterol as needed  History of alcohol use No withdrawal -Continue folate and thiamine   Anemia of chronic disease Iron studies suggest anemia related to his ongoing infection.  B12 replete         Disposition: Status is: Inpatient  Remains inpatient appropriate because:Ongoing diagnostic testing needed not  appropriate for outpatient work up   Dispo: The patient is from: Home              Anticipated d/c is to: Home              Anticipated d/c date is: > 3 days              Patient currently is not medically stable to d/c.     Patient admitted with empyema.  VATS completed, chest tube in place.  PT evaluation pending after surgery, then chest tube removal, then either home or to SNF, chest tubes will need to be removed after that prior to discharge.  Likely a while until he is ready for discharge.         MDM: The below labs and imaging reports were reviewed and summarized above.  Medication management as above.    DVT prophylaxis: SCDs  Code Status: Full code Family Communication: Wife at the bedside    Consultants:   CT surgery  Procedures:   11/24 thoracentesis  11/26 VATS  Antimicrobials:   Vancomycin and cefepime 11/24>>  Flagyl 11/25>>  Culture data:   Blood cultures 1/24-no growth to date  Body fluid culture from effusion/thoracentesis 11/24-6 Streptococcus const a lot of          Subjective: No fever overnight.  He is nauseated has some pain in his left side, but his breathing is better.  No confusion, vomiting, hemoptysis       Objective: Vitals:   02/05/20 1100 02/05/20 1200 02/05/20 1300 02/05/20 1403  BP: 105/67 112/66 115/67   Pulse: 75 77 78   Resp: 14 16 15    Temp:    (!) 96.4 F (  35.8 C)  TempSrc:    Axillary  SpO2: 94% (!) 86% 96%   Weight:      Height:        Intake/Output Summary (Last 24 hours) at 02/05/2020 1439 Last data filed at 02/05/2020 1336 Gross per 24 hour  Intake 5054.66 ml  Output 2030 ml  Net 3024.66 ml   Filed Weights   02/02/20 1208 02/02/20 2231 02/05/20 0500  Weight: 71.2 kg 68.4 kg 69.5 kg    Examination: General appearance: Adult male, lying in bed, no obvious distress     HEENT: Anicteric, conjunctival pink, lids and lashes normal.  No nasal deformity, discharge, or epistaxis.   Dentition in poor repair, lips dry, oropharynx moist, no oral lesion Skin:  Cardiac: RRR, no murmurs, no lower extremity injury Respiratory: Lung sounds diminished, some crackles on the left, expiratory wheezing noted today, occasional.  Normal respiratory effort. Abdomen: Abdomen soft without tenderness palpation or guarding, no ascites or distention, no hepatosplenomegaly MSK:  Neuro: Awake and alert, extraocular movements intact, moves all extremities with generalized weakness, speech fluent Psych: Sensorium intact responding to questions, attention normal, affect normal, judgment insight appear normal    Data Reviewed: I have personally reviewed following labs and imaging studies:  CBC: Recent Labs  Lab 02/02/20 1339 02/04/20 0027 02/05/20 0454 02/05/20 0457  WBC 28.0* 32.2* 29.8*  --   NEUTROABS 23.8*  --   --   --   HGB 10.5* 8.8* 8.9* 9.2*  HCT 32.9* 27.1* 28.3* 27.0*  MCV 91.1 89.4 92.2  --   PLT 500* 559* 548*  --    Basic Metabolic Panel: Recent Labs  Lab 02/02/20 1339 02/04/20 0027 02/05/20 0454 02/05/20 0457  NA 136 136 138 142  K 3.6 3.7 3.6 3.4*  CL 102 108 110  --   CO2 23 19* 19*  --   GLUCOSE 117* 106* 171*  --   BUN 29* 21 25*  --   CREATININE 1.11 0.95 0.68  --   CALCIUM 8.6* 7.9* 8.1*  --    GFR: Estimated Creatinine Clearance: 90.7 mL/min (by C-G formula based on SCr of 0.68 mg/dL). Liver Function Tests: Recent Labs  Lab 02/02/20 1339 02/02/20 1903 02/04/20 0027  AST 28  --  35  ALT 17  --  31  ALKPHOS 140*  --  147*  BILITOT 1.0  --  0.9  PROT 7.4 6.2* 5.5*  ALBUMIN 2.6* 2.0* 1.6*   No results for input(s): LIPASE, AMYLASE in the last 168 hours. No results for input(s): AMMONIA in the last 168 hours. Coagulation Profile: Recent Labs  Lab 02/02/20 1903  INR 1.3*   Cardiac Enzymes: No results for input(s): CKTOTAL, CKMB, CKMBINDEX, TROPONINI in the last 168 hours. BNP (last 3 results) No results for input(s): PROBNP in the last  8760 hours. HbA1C: No results for input(s): HGBA1C in the last 72 hours. CBG: Recent Labs  Lab 02/05/20 0046 02/05/20 0424 02/05/20 0653 02/05/20 0812 02/05/20 1148  GLUCAP 144* 154* 150* 167* 210*   Lipid Profile: No results for input(s): CHOL, HDL, LDLCALC, TRIG, CHOLHDL, LDLDIRECT in the last 72 hours. Thyroid Function Tests: No results for input(s): TSH, T4TOTAL, FREET4, T3FREE, THYROIDAB in the last 72 hours. Anemia Panel: Recent Labs    02/05/20 0454  VITAMINB12 477  TIBC 104*  IRON 14*  RETICCTPCT 1.0   Urine analysis:    Component Value Date/Time   COLORURINE YELLOW 02/02/2020 1640   APPEARANCEUR HAZY (A) 02/02/2020 1640  LABSPEC 1.020 02/02/2020 1640   PHURINE 5.0 02/02/2020 1640   GLUCOSEU NEGATIVE 02/02/2020 1640   HGBUR NEGATIVE 02/02/2020 1640   BILIRUBINUR NEGATIVE 02/02/2020 1640   KETONESUR 5 (A) 02/02/2020 1640   PROTEINUR 30 (A) 02/02/2020 1640   NITRITE NEGATIVE 02/02/2020 1640   LEUKOCYTESUR NEGATIVE 02/02/2020 1640   Sepsis Labs: (procalcitonin:4,lacticacidven:4)  ) Recent Results (from the past 240 hour(s))  Resp Panel by RT-PCR (Flu A&B, Covid) Nasopharyngeal Swab     Status: None   Collection Time: 02/02/20  3:21 PM   Specimen: Nasopharyngeal Swab; Nasopharyngeal(NP) swabs in vial transport medium  Result Value Ref Range Status   SARS Coronavirus 2 by RT PCR NEGATIVE NEGATIVE Final    Comment: (NOTE) SARS-CoV-2 target nucleic acids are NOT DETECTED.  The SARS-CoV-2 RNA is generally detectable in upper respiratory specimens during the acute phase of infection. The lowest concentration of SARS-CoV-2 viral copies this assay can detect is 138 copies/mL. A negative result does not preclude SARS-Cov-2 infection and should not be used as the sole basis for treatment or other patient management decisions. A negative result may occur with  improper specimen collection/handling, submission of specimen other than nasopharyngeal  swab, presence of viral mutation(s) within the areas targeted by this assay, and inadequate number of viral copies(<138 copies/mL). A negative result must be combined with clinical observations, patient history, and epidemiological information. The expected result is Negative.  Fact Sheet for Patients:  BloggerCourse.com  Fact Sheet for Healthcare Providers:  SeriousBroker.it  This test is no t yet approved or cleared by the [place] FDA and  has been authorized for detection and/or diagnosis of SARS-CoV-2 by FDA under an Emergency Use Authorization (EUA). This EUA will remain  in effect (meaning this test can be used) for the duration of the COVID-19 declaration under Section 564(b)(1) of the Act, 21 U.S.C.section 360bbb-3(b)(1), unless the authorization is terminated  or revoked sooner.       Influenza A by PCR NEGATIVE NEGATIVE Final   Influenza B by PCR NEGATIVE NEGATIVE Final    Comment: (NOTE) The Xpert Xpress SARS-CoV-2/FLU/RSV plus assay is intended as an aid in the diagnosis of influenza from Nasopharyngeal swab specimens and should not be used as a sole basis for treatment. Nasal washings and aspirates are unacceptable for Xpert Xpress SARS-CoV-2/FLU/RSV testing.  Fact Sheet for Patients: BloggerCourse.com  Fact Sheet for Healthcare Providers: SeriousBroker.it  This test is not yet approved or cleared by the [place] FDA and has been authorized for detection and/or diagnosis of SARS-CoV-2 by FDA under an Emergency Use Authorization (EUA). This EUA will remain in effect (meaning this test can be used) for the duration of the COVID-19 declaration under Section 564(b)(1) of the Act, 21 U.S.C. section 360bbb-3(b)(1), unless the authorization is terminated or revoked.  Performed at [facility], [address], [place], [place] [address]   Blood Culture  (routine x 2)     Status: None (Preliminary result)   Collection Time: 02/02/20  3:41 PM   Specimen: BLOOD  Result Value Ref Range Status   Specimen Description BLOOD  Final   Special Requests NONE  Final   Culture   Final    NO GROWTH 3 DAYS Performed at [facility], [address] [place], [place] [address]    Report Status PENDING  Incomplete  Blood Culture (routine x 2)     Status: None (Preliminary result)   Collection Time: 02/02/20  3:41 PM   Specimen: BLOOD  Result Value  Ref Range Status   Specimen Description BLOOD LEFT ANTECUBITAL  Final   Special Requests   Final    BOTTLES DRAWN AEROBIC AND ANAEROBIC Blood Culture adequate volume   Culture   Final    NO GROWTH 3 DAYS Performed at [facility], [address], [place], [place] [address]    Report Status PENDING  Incomplete  Urine culture     Status: None   Collection Time: 02/02/20  4:40 PM   Specimen: Urine, Clean Catch  Result Value Ref Range Status   Specimen Description   Final    URINE, CLEAN CATCH Performed at [facility], [address], [place], [place] [address]    Special Requests   Final    NONE Performed at [facility], [address] [place], [place] [address]    Culture   Final    NO GROWTH Performed at [facility], [address], [place], [place] [address]    Report Status 02/04/2020 FINAL  Final  Fungus Culture With Stain     Status: None (Preliminary result)   Collection Time: 02/02/20  5:40 PM   Specimen: Lung; Pleural Fluid  Result Value Ref Range Status   Fungus Stain Final report  Final    Comment: (NOTE) Performed At: [facility] [address] [place], [place] [id number] Jolene Schimke MD XT:[id number]    Fungus (Mycology) Culture PENDING  Incomplete   Fungal Source PLEURAL  Final    Comment: Performed at [facility], [address], [place], [place] [address]  Culture, body fluid-bottle     Status: Abnormal   Collection Time: 02/02/20  5:40 PM   Specimen:  Pleura  Result Value Ref Range Status   Specimen Description   Final    PLEURAL Performed at [facility], [address], [place], [place] [address]    Special Requests   Final    BOTTLES DRAWN AEROBIC AND ANAEROBIC 10CC Performed at [facility], [address], [place], [place] [address]    Gram Stain   Final    GRAM POSITIVE COCCI IN BOTH AEROBIC AND ANAEROBIC BOTTLES Gram Stain Report Called to,Read Back By and Verified With: T Arlis Porta @2140  02/02/20 MKELLY Performed at [facility], [address] [place], [place]    Culture STREPTOCOCCUS CONSTELLATUS (A)  Final   Report Status 02/05/2020 FINAL  Final   Organism ID, Bacteria STREPTOCOCCUS CONSTELLATUS  Final      Susceptibility   Streptococcus constellatus - MIC*    PENICILLIN INTERMEDIATE Intermediate     CEFTRIAXONE 1 SENSITIVE Sensitive     ERYTHROMYCIN <=0.12 SENSITIVE Sensitive     LEVOFLOXACIN <=0.25 SENSITIVE Sensitive     VANCOMYCIN 0.25 SENSITIVE Sensitive     * STREPTOCOCCUS CONSTELLATUS  Gram stain     Status: None   Collection Time: 02/02/20  5:40 PM   Specimen: Pleura  Result Value Ref Range Status   Specimen Description PLEURAL  Final   Special Requests NONE  Final   Gram Stain   Final    ABUNDANT GRAM POSITIVE COCCI RARE GRAM POSITIVE RODS WBC PRESENT, PREDOMINANTLY MONONUCLEAR Gram Stain Report Called to,Read Back By and Verified With: TONI WALKER@1936  02/02/20 BY JONES,T Performed at [facility], [address], [place], [place]    Report Status 02/02/2020 FINAL  Final  Fungus Culture Result     Status: None   Collection Time: 02/02/20  5:40 PM  Result Value Ref Range Status   Result 1 Comment  Final    Comment: (NOTE) KOH/Calcofluor preparation:  no fungus observed. Performed At: [facility] [address] [place], [place] [id number] Jolene SchimkeNagendra Sanjai MD JX:[id number]:[phone]   Surgical pcr screen     Status: None   Collection Time: 02/02/20 10:39 PM   Specimen: Nasal  Mucosa; Nasal Swab  Result Value Ref Range Status   MRSA, PCR NEGATIVE NEGATIVE Final   Staphylococcus aureus NEGATIVE NEGATIVE Final    Comment: (NOTE) The Xpert SA Assay (FDA approved for NASAL specimens in patients 61 years of age and older), is one component of a comprehensive surveillance program. It is not intended to diagnose infection nor to guide or monitor treatment. Performed at [facility], [address] [place], [place] [address]   Aerobic/Anaerobic Culture (surgical/deep wound)     Status: None (Preliminary result)   Collection Time: 02/04/20  4:32 PM   Specimen: PATH Other; Body Fluid  Result Value Ref Range Status   Specimen Description FLUID PLEURAL LEFT  Final   Special Requests NONE  Final   Gram Stain   Final    MODERATE WBC PRESENT,BOTH PMN AND MONONUCLEAR ABUNDANT GRAM POSITIVE COCCI MODERATE GRAM NEGATIVE RODS    Culture   Final    NO GROWTH < 24 HOURS Performed at [facility], [address] [place], [place] [address]    Report Status PENDING  Incomplete  Aerobic/Anaerobic Culture (surgical/deep wound)     Status: None (Preliminary result)   Collection Time: 02/04/20  5:21 PM   Specimen: PATH Soft tissue resection  Result Value Ref Range Status   Specimen Description TISSUE PLEURAL  Final   Special Requests PLEURAL PEEL PT ON VANC  Final   Gram Stain   Final    FEW WBC PRESENT,BOTH PMN AND MONONUCLEAR MODERATE GRAM POSITIVE COCCI FEW GRAM NEGATIVE RODS    Culture   Final    NO GROWTH < 24 HOURS Performed at [facility], [address] [place], [place] [address]    Report Status PENDING  Incomplete         Radiology Studies: DG Chest Port 1 View  Result Date: 02/05/2020 CLINICAL DATA:  Chest tube present.  Status post thoracotomy EXAM: PORTABLE CHEST 1 VIEW COMPARISON:  February 04, 2020 FINDINGS: Chest tubes are unchanged in position on the left. No pneumothorax. There is mild loculated pleural effusion on the left  with ill-defined opacity in portions of the left mid and lower lung zones, marginally less than on the study from 1 day prior. Ill-defined opacity in the right mid lung persists. No new opacity on the right. Heart is upper normal in size with pulmonary vascularity. No adenopathy. Old healed fracture right clavicle noted. IMPRESSION: Marginally less opacity left mid and lower lung regions. Combination of loculated pleural effusion and ill-defined airspace opacity on the left. No pneumothorax. Ill-defined opacity right mid lung which appear slightly less nodular at this time compared to 1 day prior. No new opacity on the right. Stable cardiac silhouette. Electronically Signed   By: Bretta BangWilliam  Woodruff III M.D.   On: 02/05/2020 08:14   DG Chest Port 1 View  Result Date: 02/04/2020 CLINICAL DATA:  59 year old male status post thoracotomy. EXAM: PORTABLE CHEST 1 VIEW COMPARISON:  Chest radiograph dated 02/02/2020 and CT dated 02/02/2020 FINDINGS: Left-sided chest tubes with tips in the left apex and left lung base. Patchy area of consolidation involving the left mid and lower lung field may represent atelectasis or postsurgical changes.  A small left pleural effusion may be present. No pneumothorax. A 2 cm focal subpleural nodule in the right mid lung field. Stable cardiac silhouette. No acute osseous pathology. Left chest wall soft tissue air and cutaneous clips, postsurgical. IMPRESSION: 1. Left-sided chest tubes. No pneumothorax. 2. Patchy area of consolidation involving the left mid and lower lung field may represent atelectasis or postsurgical changes. Electronically Signed   By: Elgie Collard M.D.   On: 02/04/2020 19:08        Scheduled Meds: . acetaminophen  1,000 mg Oral Q6H   Or  . acetaminophen (TYLENOL) oral liquid 160 mg/5 mL  1,000 mg Oral Q6H  . Chlorhexidine Gluconate Cloth  6 each Topical Daily  . folic acid  1 mg Oral Daily  . guaiFENesin  600 mg Oral BID  . insulin aspart  0-24 Units  Subcutaneous Q4H  . levalbuterol  0.63 mg Nebulization Q6H  . mouth rinse  15 mL Mouth Rinse BID  . metoCLOPramide (REGLAN) injection  10 mg Intravenous Q6H  . metroNIDAZOLE  500 mg Oral Q8H  . morphine   Intravenous Q4H  . multivitamin with minerals  1 tablet Oral Daily  . nicotine  14 mg Transdermal Daily  . potassium chloride  20 mEq Oral Q4H  . senna-docusate  1 tablet Oral QHS   Continuous Infusions: . ceFEPime (MAXIPIME) IV 2 g (02/05/20 1259)  . dextrose 5 % and 0.9% NaCl 100 mL/hr at 02/05/20 1300  . vancomycin Stopped (02/05/20 0537)     LOS: 3 days    Time spent: 25 minutes    Alberteen Sam, MD [facility] 02/05/2020, 2:39 PM     Please page though AMION or Epic secure chat:  For [employer], Higher education careers adviser

## 2020-02-05 NOTE — Progress Notes (Signed)
Transferred-in from [facility] by chair awake and alert. Left chest tube intact.

## 2020-02-05 NOTE — Hospital Course (Addendum)
Hospital Course:  Mr. Petersen was taken to the operating room on 02/04/2020.  He underwent left muscle sparing thoracotomy with drainage of empyema and decortication of the lung.  He tolerated the procedure without difficulty, was extubated, and was taken to the SICU in stable condition.  The patient progressed post operatively.  His cultures from his Thoracentesis grew Strepotcoccus Constellatus.  Per sensitivity he will be treated with Rocephin.

## 2020-02-06 ENCOUNTER — Inpatient Hospital Stay (HOSPITAL_COMMUNITY): Payer: Medicaid - Out of State

## 2020-02-06 DIAGNOSIS — R6520 Severe sepsis without septic shock: Secondary | ICD-10-CM | POA: Diagnosis not present

## 2020-02-06 DIAGNOSIS — A419 Sepsis, unspecified organism: Secondary | ICD-10-CM | POA: Diagnosis not present

## 2020-02-06 LAB — CBC
HCT: 27.3 % — ABNORMAL LOW (ref 39.0–52.0)
Hemoglobin: 8.7 g/dL — ABNORMAL LOW (ref 13.0–17.0)
MCH: 28.4 pg (ref 26.0–34.0)
MCHC: 31.9 g/dL (ref 30.0–36.0)
MCV: 89.2 fL (ref 80.0–100.0)
Platelets: 560 10*3/uL — ABNORMAL HIGH (ref 150–400)
RBC: 3.06 MIL/uL — ABNORMAL LOW (ref 4.22–5.81)
RDW: 14.1 % (ref 11.5–15.5)
WBC: 31.1 10*3/uL — ABNORMAL HIGH (ref 4.0–10.5)
nRBC: 0 % (ref 0.0–0.2)

## 2020-02-06 LAB — BASIC METABOLIC PANEL
Anion gap: 9 (ref 5–15)
BUN: 24 mg/dL — ABNORMAL HIGH (ref 8–23)
CO2: 18 mmol/L — ABNORMAL LOW (ref 22–32)
Calcium: 7.8 mg/dL — ABNORMAL LOW (ref 8.9–10.3)
Chloride: 110 mmol/L (ref 98–111)
Creatinine, Ser: 0.71 mg/dL (ref 0.61–1.24)
GFR, Estimated: >60 mL/min (ref >60)
Glucose, Bld: 156 mg/dL — ABNORMAL HIGH (ref 70–99)
Potassium: 3.2 mmol/L — ABNORMAL LOW (ref 3.5–5.1)
Sodium: 137 mmol/L (ref 135–145)

## 2020-02-06 LAB — GLUCOSE, CAPILLARY
Glucose-Capillary: 131 mg/dL — ABNORMAL HIGH (ref 70–99)
Glucose-Capillary: 145 mg/dL — ABNORMAL HIGH (ref 70–99)

## 2020-02-06 MED ORDER — POTASSIUM CHLORIDE CRYS ER 20 MEQ PO TBCR
40.0000 meq | EXTENDED_RELEASE_TABLET | Freq: Once | ORAL | Status: AC
  Administered 2020-02-06: 40 meq via ORAL
  Filled 2020-02-06: qty 2

## 2020-02-06 MED ORDER — LEVALBUTEROL HCL 0.63 MG/3ML IN NEBU: 0.6300 mg | INHALATION_SOLUTION | Freq: Four times a day (QID) | RESPIRATORY_TRACT | Status: DC | PRN

## 2020-02-06 NOTE — Anesthesia Postprocedure Evaluation (Signed)
Anesthesia Post Note  Patient: Francisco Pittman  Procedure(s) Performed: LEFT THORACOTOMY, DRAINAGE OF EMPYEMA, CHEST TUBE INSERTION (Left Chest) CHEST TUBE INSERTION (Left Chest)     Patient location during evaluation: PACU Anesthesia Type: General Level of consciousness: awake and alert Pain management: pain level controlled Vital Signs Assessment: post-procedure vital signs reviewed and stable Respiratory status: spontaneous breathing, nonlabored ventilation, respiratory function stable and patient connected to nasal cannula oxygen Cardiovascular status: blood pressure returned to baseline and stable Postop Assessment: no apparent nausea or vomiting Anesthetic complications: no   No complications documented.  Last Vitals:  Vitals:   02/06/20 1118 02/06/20 1200  BP: 124/66   Pulse: 66   Resp: 15 19  Temp: 36.4 C   SpO2: 94% 91%    Last Pain:  Vitals:   02/06/20 1200  TempSrc:   PainSc: 6                  Quantel Mcinturff DAVID

## 2020-02-06 NOTE — Progress Notes (Addendum)
      [address]       [name] [address]             [phone]      2 Days Post-Op Procedure(s) (LRB): LEFT THORACOTOMY, DRAINAGE OF EMPYEMA, CHEST TUBE INSERTION (Left) CHEST TUBE INSERTION (Left)   Subjective:  Patient states feeling a little bit better.  Overall breathing is improved.  Pain at chest tube sites, relieved with use of pain medication.  No BM yet, + flatus  Objective: Vital signs in last 24 hours: Temp:  [96.4 F (35.8 C)-98.5 F (36.9 C)] 98.5 F (36.9 C) (11/28 0805) Pulse Rate:  [61-81] 75 (11/28 0805) Cardiac Rhythm: Normal sinus rhythm (11/28 0709) Resp:  [11-18] 18 (11/28 0805) BP: (99-119)/(57-75) 119/62 (11/28 0805) SpO2:  [86 %-98 %] 94 % (11/28 0805) Arterial Line BP: (107)/(51) 107/51 (11/27 0900) FiO2 (%):  [27 %-28 %] 27 % (11/28 0759)  Intake/Output from previous day: 11/27 0701 - 11/28 0700 In: 2674 [P.O.:840; I.V.:1734; IV Piggyback:100] Out: 1255 [Urine:885; Chest Tube:370]  General appearance: alert, cooperative and no distress Heart: regular rate and rhythm Lungs: diminished breath sounds on left Wound: clean and dry, staples in place  Lab Results: Recent Labs    02/05/20 0454 02/05/20 0454 02/05/20 0457 02/06/20 0056  WBC 29.8*  --   --  31.1*  HGB 8.9*   < > 9.2* 8.7*  HCT 28.3*   < > 27.0* 27.3*  PLT 548*  --   --  560*   < > = values in this interval not displayed.   BMET:  Recent Labs    02/05/20 0454 02/05/20 0454 02/05/20 0457 02/06/20 0056  NA 138   < > 142 137  K 3.6   < > 3.4* 3.2*  CL 110  --   --  110  CO2 19*  --   --  18*  GLUCOSE 171*  --   --  156*  BUN 25*  --   --  24*  CREATININE 0.68  --   --  0.71  CALCIUM 8.1*  --   --  7.8*   < > = values in this interval not displayed.    PT/INR: No results for input(s): LABPROT, INR in the last 72 hours. ABG    Component Value Date/Time   PHART 7.371 02/05/2020 0457   HCO3 20.5 02/05/2020 0457   TCO2 22 02/05/2020 0457    ACIDBASEDEF 5.0 (H) 02/05/2020 0457   O2SAT 95.0 02/05/2020 0457   CBG (last 3)  Recent Labs    02/05/20 2350 02/06/20 0408 02/06/20 0758  GLUCAP 142* 131* 145*    Assessment/Plan: S/P Procedure(s) (LRB): LEFT THORACOTOMY, DRAINAGE OF EMPYEMA, CHEST TUBE INSERTION (Left) CHEST TUBE INSERTION (Left)  1. CV- hemodynamically stable in NSR 2. Pulm- no air leak present, CT with 370 cc output, leave on suction today, okay to unhook as patient needs to be up ambulating 3. ID- afebrile, + leukocytosis... Cultures from Thoracentesis 11/24 grew Streptococcus Constellatus-- on Rocephin per sensitivities 4. GI- oral intake as able, will d/c IV fluids and SSIP 5. DIspo- patient stable, leave chest tubes in place on suction, okay to unhook for patient to ambulate, repeat CXR in AM, care per primary    LOS: 4 days    Lowella Dandy, PA-C 02/06/2020 Pt seen and examined; CXR improved but wbc remains elevated. Clinically stable.  Jameal Razzano Z. Vickey Sages, MD [phone]

## 2020-02-06 NOTE — Plan of Care (Signed)

## 2020-02-06 NOTE — Progress Notes (Signed)
[facility] PROGRESS NOTE    Francisco Pittman  HQI:[id number] DOB: 09-16-1960 DOA: 02/02/2020 PCP: Patient, No Pcp Per      Brief Narrative:  Francisco Pittman is a 59 y.o. M with smoking, EtOH use who presents with non-resolving pain, cough, malaise after recent pneumonia.  Patient admitted to APS last month for multifocal pneumonia, treated with IV antibiotics, and discharged with oral antibiotics.  After completion of oral antibiotics, he continued to have severe left-sided pain, malaise, chills, and cough, and so he returned to the ER.  On readmission, CT of the chest showed a large loculated pleural effusion, progressed from previous.  Thoracentesis was obtained and about 200 cc of purulent foul-smelling fluid returned, which had polymicrobial Gram stain.  Case was discussed with CT surgery, who appear to have recommended transfer for VATS.          Assessment & Plan:  Empyema S/p VATS 11/26 by Dr. Lavinia Sharps Patient with large loculated left lower lobe parapneumonic effusion, aspirate frankly purulent with positive Gram stain.  Recent hospitalization with IV antibiotics.  VATS 11/26, chest tubes in place.  Culture from thoracentesis growing Streptococcus constellatus  Afebrile, still with significant pain, CT surgery still have a chest tube to suction. -Continue ceftriaxone  -Consult CT surgery, appreciate recommendations -Chest tube management per CT surgery     Smoking Smoking cessation recommended, modalities discussed  Suspected COPD This is a likely although unconfirmed diagnosis.  Not his primary symptomology Still wheezing today -Albuterol as needed  History of alcohol use No withdrawal -Continue folate and thiamine   Anemia of chronic disease Iron studies suggest anemia related to his ongoing infection.  B12 replete Stable  Hypokalemia -Supplement potassium -Check magnesium    Disposition: Status is: Inpatient  Remains inpatient  appropriate because:Ongoing diagnostic testing needed not appropriate for outpatient work up   Dispo: The patient is from: Home              Anticipated d/c is to: Home              Anticipated d/c date is: > 3 days              Patient currently is not medically stable to d/c.     Patient admitted with empyema.  VATS completed, chest tube in place.  PT evaluation pending, as well as chest tube removal, then either home or to SNF.         MDM: The below labs and imaging reports were reviewed and summarized above.  Medication management as above.    DVT prophylaxis: SCDs  Code Status: Full code Family Communication: None present    Consultants:   CT surgery  Procedures:   11/24 thoracentesis  11/26 VATS  Antimicrobials:   Vancomycin and cefepime 11/24>>  Flagyl 11/25>>  Culture data:   Blood cultures 1/24-no growth to date  Body fluid culture from effusion/thoracentesis 11/24-6 Streptococcus constellatus          Subjective: No new fever, confusion, vomiting, hemoptysis, bleeding.  Nausea resolved.  Still constipated.       Objective: Vitals:   02/06/20 1118 02/06/20 1200 02/06/20 1410 02/06/20 1551  BP: 124/66     Pulse: 66     Resp: Temp: 97.6 F (36.4 C)     TempSrc: Oral     SpO2: 94% 91% 95% 94%  Weight:      Height:        Intake/Output Summary (  Last 24 hours) at 02/06/2020 1602 Last data filed at 02/06/2020 0620 Gross per 24 hour  Intake 1214.85 ml  Output 870 ml  Net 344.85 ml   Filed Weights   02/02/20 1208 02/02/20 2231 02/05/20 0500  Weight: 71.2 kg 68.4 kg 69.5 kg    Examination: General appearance: Adult male, lying in bed, with PCA pump, appears uncomfortable, but is interactive and appropriate     HEENT: Anicteric, conjunctival pink, lids and lashes normal, no nasal deformity, discharge, or epistaxis, dentition in poor repair, lips dry, oropharynx moist, no oral lesions, hearing normal Skin:   Cardiac: RRR, systolic murmur noted, no lower extremity edema Respiratory: Respirations shallow, diminished, diminished on the left, some crackles bilaterally, no wheezing.  Normal respiratory effort, just shallow. Abdomen: Abdomen soft without tenderness palpation or guarding, no ascites or distention. MSK:  Neuro: Awake and alert, extraocular movements intact, moves all extremities with generalized weakness, speech fluent Psych: Attention normal, affect pained.     Data Reviewed: I have personally reviewed following labs and imaging studies:  CBC: Recent Labs  Lab 02/02/20 1339 02/04/20 0027 02/05/20 0454 02/05/20 0457 02/06/20 0056  WBC 28.0* 32.2* 29.8*  --  31.1*  NEUTROABS 23.8*  --   --   --   --   HGB 10.5* 8.8* 8.9* 9.2* 8.7*  HCT 32.9* 27.1* 28.3* 27.0* 27.3*  MCV 91.1 89.4 92.2  --  89.2  PLT 500* 559* 548*  --  560*   Basic Metabolic Panel: Recent Labs  Lab 02/02/20 1339 02/04/20 0027 02/05/20 0454 02/05/20 0457 02/06/20 0056  NA 136 136 138 142 137  K 3.6 3.7 3.6 3.4* 3.2*  CL 102 108 110  --  110  CO2 23 19* 19*  --  18*  GLUCOSE 117* 106* 171*  --  156*  BUN 29* 21 25*  --  24*  CREATININE 1.11 0.95 0.68  --  0.71  CALCIUM 8.6* 7.9* 8.1*  --  7.8*   GFR: Estimated Creatinine Clearance: 90.7 mL/min (by C-G formula based on SCr of 0.71 mg/dL). Liver Function Tests: Recent Labs  Lab 02/02/20 1339 02/02/20 1903 02/04/20 0027  AST 28  --  35  ALT 17  --  31  ALKPHOS 140*  --  147*  BILITOT 1.0  --  0.9  PROT 7.4 6.2* 5.5*  ALBUMIN 2.6* 2.0* 1.6*   No results for input(s): LIPASE, AMYLASE in the last 168 hours. No results for input(s): AMMONIA in the last 168 hours. Coagulation Profile: Recent Labs  Lab 02/02/20 1903  INR 1.3*   Cardiac Enzymes: No results for input(s): CKTOTAL, CKMB, CKMBINDEX, TROPONINI in the last 168 hours. BNP (last 3 results) No results for input(s): PROBNP in the last 8760 hours. HbA1C: No results for input(s):  HGBA1C in the last 72 hours. CBG: Recent Labs  Lab 02/05/20 1601 02/05/20 2034 02/05/20 2350 02/06/20 0408 02/06/20 0758  GLUCAP 174* 133* 142* 131* 145*   Lipid Profile: No results for input(s): CHOL, HDL, LDLCALC, TRIG, CHOLHDL, LDLDIRECT in the last 72 hours. Thyroid Function Tests: No results for input(s): TSH, T4TOTAL, FREET4, T3FREE, THYROIDAB in the last 72 hours. Anemia Panel: Recent Labs    02/05/20 0454  VITAMINB12 477  TIBC 104*  IRON 14*  RETICCTPCT 1.0   Urine analysis:    Component Value Date/Time   COLORURINE YELLOW 02/02/2020 1640   APPEARANCEUR HAZY (A) 02/02/2020 1640   LABSPEC 1.020 02/02/2020 1640   PHURINE 5.0 02/02/2020 1640  GLUCOSEU NEGATIVE 02/02/2020 1640   HGBUR NEGATIVE 02/02/2020 1640   BILIRUBINUR NEGATIVE 02/02/2020 1640   KETONESUR 5 (A) 02/02/2020 1640   PROTEINUR 30 (A) 02/02/2020 1640   NITRITE NEGATIVE 02/02/2020 1640   LEUKOCYTESUR NEGATIVE 02/02/2020 1640   Sepsis Labs: @LABRCNTIP (procalcitonin:4,lacticacidven:4)  ) Recent Results (from the past 240 hour(s))  Resp Panel by RT-PCR (Flu A&B, Covid) Nasopharyngeal Swab     Status: None   Collection Time: 02/02/20  3:21 PM   Specimen: Nasopharyngeal Swab; Nasopharyngeal(NP) swabs in vial transport medium  Result Value Ref Range Status   SARS Coronavirus 2 by RT PCR NEGATIVE NEGATIVE Final    Comment: (NOTE) SARS-CoV-2 target nucleic acids are NOT DETECTED.  The SARS-CoV-2 RNA is generally detectable in upper respiratory specimens during the acute phase of infection. The lowest concentration of SARS-CoV-2 viral copies this assay can detect is 138 copies/mL. A negative result does not preclude SARS-Cov-2 infection and should not be used as the sole basis for treatment or other patient management decisions. A negative result may occur with  improper specimen collection/handling, submission of specimen other than nasopharyngeal swab, presence of viral mutation(s) within  the areas targeted by this assay, and inadequate number of viral copies(<138 copies/mL). A negative result must be combined with clinical observations, patient history, and epidemiological information. The expected result is Negative.  Fact Sheet for Patients:  02/04/20  Fact Sheet for Healthcare Providers:  BloggerCourse.com  This test is no t yet approved or cleared by the [place] FDA and  has been authorized for detection and/or diagnosis of SARS-CoV-2 by FDA under an Emergency Use Authorization (EUA). This EUA will remain  in effect (meaning this test can be used) for the duration of the COVID-19 declaration under Section 564(b)(1) of the Act, 21 U.S.C.section 360bbb-3(b)(1), unless the authorization is terminated  or revoked sooner.       Influenza A by PCR NEGATIVE NEGATIVE Final   Influenza B by PCR NEGATIVE NEGATIVE Final    Comment: (NOTE) The Xpert Xpress SARS-CoV-2/FLU/RSV plus assay is intended as an aid in the diagnosis of influenza from Nasopharyngeal swab specimens and should not be used as a sole basis for treatment. Nasal washings and aspirates are unacceptable for Xpert Xpress SARS-CoV-2/FLU/RSV testing.  Fact Sheet for Patients: [place]  Fact Sheet for Healthcare Providers: BloggerCourse.com  This test is not yet approved or cleared by the Serious[employer]ker.it FDA and has been authorized for detection and/or diagnosis of SARS-CoV-2 by FDA under an Emergency Use Authorization (EUA). This EUA will remain in effect (meaning this test can be used) for the duration of the COVID-19 declaration under Section 564(b)(1) of the Act, 21 U.S.C. section 360bbb-3(b)(1), unless the authorization is terminated or revoked.  Performed at [facility], [address], [place], [place]   Blood Culture (routine x 2)     Status: None (Preliminary  result)   Collection Time: 02/02/20  3:41 PM   Specimen: BLOOD  Result Value Ref Range Status   Specimen Description BLOOD  Final   Special Requests NONE  Final   Culture   Final    NO GROWTH 4 DAYS Performed at [facility], [address], [place], [place]    Report Status PENDING  Incomplete  Blood Culture (routine x 2)     Status: None (Preliminary result)   Collection Time: 02/02/20  3:41 PM   Specimen: BLOOD  Result Value Ref Range Status   Specimen Description BLOOD LEFT ANTECUBITAL  Final  Special Requests   Final    BOTTLES DRAWN AEROBIC AND ANAEROBIC Blood Culture adequate volume   Culture   Final    NO GROWTH 4 DAYS Performed at [facility], [address] [place], [place] [address]    Report Status PENDING  Incomplete  Urine culture     Status: None   Collection Time: 02/02/20  4:40 PM   Specimen: Urine, Clean Catch  Result Value Ref Range Status   Specimen Description   Final    URINE, CLEAN CATCH Performed at [facility], [address], [place], [place] [address]    Special Requests   Final    NONE Performed at [facility], [address], [place], [place] [address]    Culture   Final    NO GROWTH Performed at [facility], [address] [place], [place] [address]    Report Status 02/04/2020 FINAL  Final  Fungus Culture With Stain     Status: None (Preliminary result)   Collection Time: 02/02/20  5:40 PM   Specimen: Lung; Pleural Fluid  Result Value Ref Range Status   Fungus Stain Final report  Final    Comment: (NOTE) Performed At: [facility] [address] [place], [place] [id number] Jolene Schimke MD PJ:[phone]    Fungus (Mycology) Culture PENDING  Incomplete   Fungal Source PLEURAL  Final    Comment: Performed at [facility], [address], [place], [place] [address]  Culture, body fluid-bottle     Status: Abnormal   Collection Time: 02/02/20  5:40 PM   Specimen: Pleura  Result Value Ref Range Status    Specimen Description   Final    PLEURAL Performed at [facility], [address], [place], [place] [address]    Special Requests   Final    BOTTLES DRAWN AEROBIC AND ANAEROBIC 10CC Performed at [facility], [address], [place], [place] [address]    Gram Stain   Final    GRAM POSITIVE COCCI IN BOTH AEROBIC AND ANAEROBIC BOTTLES Gram Stain Report Called to,Read Back By and Verified With: Kennon Portela @2140  02/02/20 MKELLY Performed at [facility], [address], [place], [place]    Culture STREPTOCOCCUS CONSTELLATUS (A)  Final   Report Status 02/05/2020 FINAL  Final   Organism ID, Bacteria STREPTOCOCCUS CONSTELLATUS  Final      Susceptibility   Streptococcus constellatus - MIC*    PENICILLIN INTERMEDIATE Intermediate     CEFTRIAXONE 1 SENSITIVE Sensitive     ERYTHROMYCIN <=0.12 SENSITIVE Sensitive     LEVOFLOXACIN <=0.25 SENSITIVE Sensitive     VANCOMYCIN 0.25 SENSITIVE Sensitive     * STREPTOCOCCUS CONSTELLATUS  Gram stain     Status: None   Collection Time: 02/02/20  5:40 PM   Specimen: Pleura  Result Value Ref Range Status   Specimen Description PLEURAL  Final   Special Requests NONE  Final   Gram Stain   Final    ABUNDANT GRAM POSITIVE COCCI RARE GRAM POSITIVE RODS WBC PRESENT, PREDOMINANTLY MONONUCLEAR Gram Stain Report Called to,Read Back By and Verified With: TONI WALKER@1936  02/02/20 BY JONES,T Performed at [facility], [address] [place], [place]    Report Status 02/02/2020 FINAL  Final  Fungus Culture Result     Status: None   Collection Time: 02/02/20  5:40 PM  Result Value Ref Range Status   Result 1 Comment  Final    Comment: (NOTE) KOH/Calcofluor preparation:  no fungus observed. Performed  At: [facility] [address] [place], [place] [id number] Jolene SchimkeNagendra Sanjai MD NW:[id number]:[phone]   Surgical pcr screen     Status: None   Collection Time: 02/02/20 10:39 PM   Specimen: Nasal Mucosa; Nasal Swab  Result Value Ref Range  Status   MRSA, PCR NEGATIVE NEGATIVE Final   Staphylococcus aureus NEGATIVE NEGATIVE Final    Comment: (NOTE) The Xpert SA Assay (FDA approved for NASAL specimens in patients 61 years of age and older), is one component of a comprehensive surveillance program. It is not intended to diagnose infection nor to guide or monitor treatment. Performed at [facility], [address] [place], [place] [address]   Aerobic/Anaerobic Culture (surgical/deep wound)     Status: None (Preliminary result)   Collection Time: 02/04/20  4:32 PM   Specimen: PATH Other; Body Fluid  Result Value Ref Range Status   Specimen Description FLUID PLEURAL LEFT  Final   Special Requests NONE  Final   Gram Stain   Final    MODERATE WBC PRESENT,BOTH PMN AND MONONUCLEAR ABUNDANT GRAM POSITIVE COCCI MODERATE GRAM NEGATIVE RODS Performed at [facility], [address] [place], [place] [address]    Culture   Final    CULTURE REINCUBATED FOR BETTER GROWTH NO ANAEROBES ISOLATED; CULTURE IN PROGRESS FOR 5 DAYS    Report Status PENDING  Incomplete  Aerobic/Anaerobic Culture (surgical/deep wound)     Status: None (Preliminary result)   Collection Time: 02/04/20  5:21 PM   Specimen: PATH Soft tissue resection  Result Value Ref Range Status   Specimen Description TISSUE PLEURAL  Final   Special Requests PLEURAL PEEL PT ON VANC  Final   Gram Stain   Final    FEW WBC PRESENT,BOTH PMN AND MONONUCLEAR MODERATE GRAM POSITIVE COCCI FEW GRAM NEGATIVE RODS Performed at [facility], [address] [place], [place] [address]    Culture   Final    CULTURE REINCUBATED FOR BETTER GROWTH NO ANAEROBES ISOLATED; CULTURE IN PROGRESS FOR 5 DAYS    Report Status PENDING  Incomplete         Radiology Studies: DG Chest Port 1 View  Result Date: 02/06/2020 CLINICAL DATA:  Chest tubes in place.  Status post thoracotomy EXAM: PORTABLE CHEST 1 VIEW COMPARISON:  February 05, 2020 FINDINGS: Chest tube  positions are unchanged without pneumothorax. Persistent small loculated pleural effusion on the left with areas of ill-defined opacity in portions of the left mid and lower lung regions, stable. Patchy infiltrate right upper lobe remains. No appreciable new opacity. Heart is upper normal in size with pulmonary vascularity normal. No adenopathy. Old healed clavicle fracture on the right, stable. IMPRESSION: Chest tube positions on the left unchanged. No pneumothorax. Partially loculated left pleural effusion with patchy airspace opacity in portions of the left lung, stable. Ill-defined opacity right upper lobe persists. No new opacity evident. Stable cardiac silhouette. Electronically Signed   By: Bretta BangWilliam  Woodruff III M.D.   On: 02/06/2020 07:54   DG Chest Port 1 View  Result Date: 02/05/2020 CLINICAL DATA:  Chest tube present.  Status post thoracotomy EXAM: PORTABLE CHEST 1 VIEW COMPARISON:  February 04, 2020 FINDINGS: Chest tubes are unchanged in position on the left. No pneumothorax. There is mild loculated pleural effusion on the left with ill-defined opacity in portions of the left mid and lower lung zones, marginally less than on the study from 1 day prior. Ill-defined opacity in the right mid lung persists. No new  opacity on the right. Heart is upper normal in size with pulmonary vascularity. No adenopathy. Old healed fracture right clavicle noted. IMPRESSION: Marginally less opacity left mid and lower lung regions. Combination of loculated pleural effusion and ill-defined airspace opacity on the left. No pneumothorax. Ill-defined opacity right mid lung which appear slightly less nodular at this time compared to 1 day prior. No new opacity on the right. Stable cardiac silhouette. Electronically Signed   By: Bretta Bang III M.D.   On: 02/05/2020 08:14   DG Chest Port 1 View  Result Date: 02/04/2020 CLINICAL DATA:  59 year old male status post thoracotomy. EXAM: PORTABLE CHEST 1 VIEW  COMPARISON:  Chest radiograph dated 02/02/2020 and CT dated 02/02/2020 FINDINGS: Left-sided chest tubes with tips in the left apex and left lung base. Patchy area of consolidation involving the left mid and lower lung field may represent atelectasis or postsurgical changes. A small left pleural effusion may be present. No pneumothorax. A 2 cm focal subpleural nodule in the right mid lung field. Stable cardiac silhouette. No acute osseous pathology. Left chest wall soft tissue air and cutaneous clips, postsurgical. IMPRESSION: 1. Left-sided chest tubes. No pneumothorax. 2. Patchy area of consolidation involving the left mid and lower lung field may represent atelectasis or postsurgical changes. Electronically Signed   By: Elgie Collard M.D.   On: 02/04/2020 19:08        Scheduled Meds: . acetaminophen  1,000 mg Oral Q6H   Or  . acetaminophen (TYLENOL) oral liquid 160 mg/5 mL  1,000 mg Oral Q6H  . Chlorhexidine Gluconate Cloth  6 each Topical Daily  . folic acid  1 mg Oral Daily  . guaiFENesin  600 mg Oral BID  . levalbuterol  0.63 mg Nebulization Q6H  . mouth rinse  15 mL Mouth Rinse BID  . morphine   Intravenous Q4H  . multivitamin with minerals  1 tablet Oral Daily  . nicotine  14 mg Transdermal Daily  . senna-docusate  1 tablet Oral QHS   Continuous Infusions: . cefTRIAXone (ROCEPHIN)  IV 1 g (02/06/20 1555)     LOS: 4 days    Time spent: 25 minutes    Alberteen Sam, MD [facility] 02/06/2020, 4:02 PM     Please page though AMION or Epic secure chat:  For [employer], Higher education careers adviser

## 2020-02-07 ENCOUNTER — Inpatient Hospital Stay (HOSPITAL_COMMUNITY): Payer: Medicaid - Out of State

## 2020-02-07 ENCOUNTER — Encounter (HOSPITAL_COMMUNITY): Payer: Self-pay | Admitting: Surgery

## 2020-02-07 DIAGNOSIS — R6520 Severe sepsis without septic shock: Secondary | ICD-10-CM | POA: Diagnosis not present

## 2020-02-07 DIAGNOSIS — A419 Sepsis, unspecified organism: Secondary | ICD-10-CM | POA: Diagnosis not present

## 2020-02-07 LAB — ACID FAST SMEAR (AFB, MYCOBACTERIA): Acid Fast Smear: NEGATIVE

## 2020-02-07 LAB — BASIC METABOLIC PANEL
Anion gap: 9 (ref 5–15)
BUN: 16 mg/dL (ref 8–23)
CO2: 22 mmol/L (ref 22–32)
Calcium: 7.8 mg/dL — ABNORMAL LOW (ref 8.9–10.3)
Chloride: 109 mmol/L (ref 98–111)
Creatinine, Ser: 0.57 mg/dL — ABNORMAL LOW (ref 0.61–1.24)
GFR, Estimated: >60 mL/min (ref >60)
Glucose, Bld: 79 mg/dL (ref 70–99)
Potassium: 3.6 mmol/L (ref 3.5–5.1)
Sodium: 140 mmol/L (ref 135–145)

## 2020-02-07 LAB — TYPE AND SCREEN
ABO/RH(D): O POS
Antibody Screen: NEGATIVE
Unit division: 0
Unit division: 0

## 2020-02-07 LAB — CBC
HCT: 28.8 % — ABNORMAL LOW (ref 39.0–52.0)
Hemoglobin: 9.2 g/dL — ABNORMAL LOW (ref 13.0–17.0)
MCH: 28.2 pg (ref 26.0–34.0)
MCHC: 31.9 g/dL (ref 30.0–36.0)
MCV: 88.3 fL (ref 80.0–100.0)
Platelets: 637 10*3/uL — ABNORMAL HIGH (ref 150–400)
RBC: 3.26 MIL/uL — ABNORMAL LOW (ref 4.22–5.81)
RDW: 14.2 % (ref 11.5–15.5)
WBC: 20.6 10*3/uL — ABNORMAL HIGH (ref 4.0–10.5)
nRBC: 0 % (ref 0.0–0.2)

## 2020-02-07 LAB — CULTURE, BLOOD (ROUTINE X 2)
Culture: NO GROWTH
Culture: NO GROWTH
Special Requests: ADEQUATE

## 2020-02-07 LAB — BPAM RBC
Blood Product Expiration Date: 202112262359
Blood Product Expiration Date: 202112262359
Unit Type and Rh: 5100
Unit Type and Rh: 5100

## 2020-02-07 LAB — CYTOLOGY - NON PAP

## 2020-02-07 LAB — MAGNESIUM: Magnesium: 1.9 mg/dL (ref 1.7–2.4)

## 2020-02-07 MED ORDER — POLYETHYLENE GLYCOL 3350 17 G PO PACK
17.0000 g | PACK | Freq: Every day | ORAL | Status: DC
  Administered 2020-02-08 – 2020-02-13 (×6): 17 g via ORAL
  Filled 2020-02-07 (×8): qty 1

## 2020-02-07 MED ORDER — LEVOFLOXACIN IN D5W 750 MG/150ML IV SOLN
750.0000 mg | INTRAVENOUS | Status: DC
  Administered 2020-02-07 – 2020-02-14 (×8): 750 mg via INTRAVENOUS
  Filled 2020-02-07 (×9): qty 150

## 2020-02-07 MED ORDER — SODIUM CHLORIDE 0.9 % IV SOLN
2.0000 g | INTRAVENOUS | Status: DC
  Filled 2020-02-07: qty 20

## 2020-02-07 NOTE — Progress Notes (Addendum)
      [address]       [place] [address]             [phone]      3 Days Post-Op Procedure(s) (LRB): LEFT THORACOTOMY, DRAINAGE OF EMPYEMA, CHEST TUBE INSERTION (Left) CHEST TUBE INSERTION (Left)   Subjective:  Patient states he feels rough.  He did not ambulate yesterday. Educated patient on importance of ambulation.   Objective: Vital signs in last 24 hours: Temp:  [97.6 F (36.4 C)-98.5 F (36.9 C)] 97.8 F (36.6 C) (11/29 0528) Pulse Rate:  [66-80] 72 (11/29 0528) Cardiac Rhythm: Normal sinus rhythm (11/29 0708) Resp:  [15-24] 20 (11/29 0720) BP: (118-136)/(62-77) 136/76 (11/29 0528) SpO2:  [89 %-96 %] 92 % (11/29 0720) FiO2 (%):  [28 %-32 %] 32 % (11/29 0720) Weight:  [73 kg] 73 kg (11/29 0528)  Intake/Output from previous day: 11/28 0701 - 11/29 0700 In: -  Out: 1025 [Urine:825; Chest Tube:200]  General appearance: alert, cooperative and no distress Heart: regular rate and rhythm Lungs: diminished breath sounds on left Wound: clean and dry, staples in place  Lab Results: Recent Labs    02/06/20 0056 02/07/20 0110  WBC 31.1* 20.6*  HGB 8.7* 9.2*  HCT 27.3* 28.8*  PLT 560* 637*   BMET:  Recent Labs    02/06/20 0056 02/07/20 0110  NA 137 140  K 3.2* 3.6  CL 110 109  CO2 18* 22  GLUCOSE 156* 79  BUN 24* 16  CREATININE 0.71 0.57*  CALCIUM 7.8* 7.8*    PT/INR: No results for input(s): LABPROT, INR in the last 72 hours. ABG    Component Value Date/Time   PHART 7.371 02/05/2020 0457   HCO3 20.5 02/05/2020 0457   TCO2 22 02/05/2020 0457   ACIDBASEDEF 5.0 (H) 02/05/2020 0457   O2SAT 95.0 02/05/2020 0457   CBG (last 3)  Recent Labs    02/05/20 2350 02/06/20 0408 02/06/20 0758  GLUCAP 142* 131* 145*    Assessment/Plan: S/P Procedure(s) (LRB): LEFT THORACOTOMY, DRAINAGE OF EMPYEMA, CHEST TUBE INSERTION (Left) CHEST TUBE INSERTION (Left)  1. CV- hemodynamically stable in NSR 2. Pulm- CT output 450 cc yesterday,  tubes will need to remain in place until drainage stops, CXR with stable appearance of effusion, + atelectasis 3. ID- afebrile, leukocytosis improved to 20, on Rocephin  4. Dispo- patient stable, continue current care per primary   LOS: 5 days    Lowella Dandy, PA-C 02/07/2020    Chart reviewed, patient examined, agree with above. CXR stable airspace disease and pleural thickening. Keep chest tubes in until minimal drainage. Culture from OR grew Viridans Strep and culture from thoracentesis grew Strep Constellatus, both mouth flora. Continue antibiotic, IS.

## 2020-02-07 NOTE — Plan of Care (Signed)
  Problem: Education: Goal: Knowledge of General Education information will improve Description: Including pain rating scale, medication(s)/side effects and non-pharmacologic comfort measures 02/07/2020 1940 by Luna Kitchens, RN Outcome: Progressing 02/07/2020 0727 by Luna Kitchens, RN Outcome: Not Progressing   Problem: Health Behavior/Discharge Planning: Goal: Ability to manage health-related needs will improve 02/07/2020 1940 by Luna Kitchens, RN Outcome: Progressing 02/07/2020 0727 by Luna Kitchens, RN Outcome: Not Progressing   Problem: Clinical Measurements: Goal: Ability to maintain clinical measurements within normal limits will improve 02/07/2020 1940 by Luna Kitchens, RN Outcome: Progressing 02/07/2020 0727 by Luna Kitchens, RN Outcome: Not Progressing Goal: Will remain free from infection 02/07/2020 1940 by Luna Kitchens, RN Outcome: Progressing 02/07/2020 0727 by Luna Kitchens, RN Outcome: Not Progressing Goal: Diagnostic test results will improve 02/07/2020 1940 by Luna Kitchens, RN Outcome: Progressing 02/07/2020 0727 by Luna Kitchens, RN Outcome: Not Progressing Goal: Respiratory complications will improve 02/07/2020 1940 by Luna Kitchens, RN Outcome: Progressing 02/07/2020 0727 by Luna Kitchens, RN Outcome: Not Progressing Goal: Cardiovascular complication will be avoided 02/07/2020 1940 by Luna Kitchens, RN Outcome: Progressing 02/07/2020 0727 by Luna Kitchens, RN Outcome: Not Progressing   Problem: Activity: Goal: Risk for activity intolerance will decrease 02/07/2020 1940 by Luna Kitchens, RN Outcome: Progressing 02/07/2020 0727 by Luna Kitchens, RN Outcome: Not Progressing   Problem: Nutrition: Goal: Adequate nutrition will be maintained 02/07/2020 1940 by Luna Kitchens, RN Outcome: Progressing 02/07/2020 0727 by Luna Kitchens, RN Outcome: Not Progressing   Problem: Elimination: Goal: Will not experience complications related to bowel  motility 02/07/2020 1940 by Luna Kitchens, RN Outcome: Progressing 02/07/2020 0727 by Luna Kitchens, RN Outcome: Not Progressing Goal: Will not experience complications related to urinary retention 02/07/2020 1940 by Luna Kitchens, RN Outcome: Progressing 02/07/2020 0727 by Luna Kitchens, RN Outcome: Not Progressing   Problem: Coping: Goal: Level of anxiety will decrease 02/07/2020 1940 by Luna Kitchens, RN Outcome: Progressing 02/07/2020 0727 by Luna Kitchens, RN Outcome: Not Progressing   Problem: Pain Managment: Goal: General experience of comfort will improve 02/07/2020 1940 by Luna Kitchens, RN Outcome: Progressing 02/07/2020 0727 by Luna Kitchens, RN Outcome: Not Progressing   Problem: Safety: Goal: Ability to remain free from injury will improve 02/07/2020 1940 by Luna Kitchens, RN Outcome: Progressing 02/07/2020 0727 by Luna Kitchens, RN Outcome: Not Progressing   Problem: Skin Integrity: Goal: Risk for impaired skin integrity will decrease 02/07/2020 1940 by Luna Kitchens, RN Outcome: Progressing 02/07/2020 0727 by Luna Kitchens, RN Outcome: Not Progressing

## 2020-02-07 NOTE — Progress Notes (Addendum)
[facility] PROGRESS NOTE    Leta SpellerLuther Perdomo  ZOX:[id number] DOB: May 21, 1960 DOA: 02/02/2020 PCP: Patient, No Pcp Per      Brief Narrative:  [name] is a 59 y.o. M with smoking, EtOH use who presents with non-resolving pain, cough, malaise after recent pneumonia.  Patient admitted to APS last month for multifocal pneumonia, treated with IV antibiotics, and discharged with oral antibiotics.  After completion of oral antibiotics, he continued to have severe left-sided pain, malaise, chills, and cough, and so he returned to the ER.  On readmission, CT of the chest showed a large loculated pleural effusion, progressed from previous.  Thoracentesis was obtained and about 200 cc of purulent foul-smelling fluid returned, which had polymicrobial Gram stain.  Case was discussed with CT surgery, who appear to have recommended transfer for VATS.          Assessment & Plan:  Empyema S/p VATS 11/26 by Dr. Lavinia SharpsBartel Patient with large loculated left lower lobe parapneumonic effusion, aspirate frankly purulent.Culture from thoracentesis growing Streptococcus constellatus  VATS 11/26, chest tubes in place.  Still plenty of output, afebrile.   WBC better.  -Continue CTX  -Consult CT surgery, appreciate recommendations -Chest tube management per CT surgery   ADDENDUM: Discussed intraoperative culture with ID RPh.  This is the same isolate, despite naming discrepancy, but ID RPh notes resistance pattern of second assay.  Again, there is a dilution discrepancy compared with previous culture, but more conservative approach would be to consider his isolate intermediate to CTX and transition to FQ.  -Stop CTX -Start Levaquin     Smoking Smoking cessation recommended, modalities discussed  Suspected COPD This is a likely although unconfirmed diagnosis.  Not his primary symptomology Still wheezing today -Albuterol as needed  History of alcohol use No  withdrawal -Continue folate and thiamine   Anemia of chronic disease Iron studies suggest anemia related to his ongoing infection.  B12 replete Hgb stable  Hypokalemia Resolved, Mag normal      Disposition: Status is: Inpatient  Remains inpatient appropriate because:Ongoing diagnostic testing needed not appropriate for outpatient work up   Dispo: The patient is from: Home              Anticipated d/c is to: Home              Anticipated d/c date is: > 3 days              Patient currently is not medically stable to d/c.     Patient admitted with empyema.  VATS completed, chest tube in place.  PT evaluation pending, as well as chest tube removal, then either home or to SNF.         MDM: The below labs and imaging reports were reviewed and summarized above.  Medication management as above.    DVT prophylaxis: SCDs  Code Status: Full code Family Communication: None present    Consultants:   CT surgery  Procedures:   11/24 thoracentesis  11/26 VATS  Antimicrobials:   Vancomycin and cefepime 11/24>>  Flagyl 11/25>>  Culture data:   Blood cultures 1/24-no growth to date  Body fluid culture from effusion/thoracentesis 11/24-6 Streptococcus constellatus          Subjective: No new fever, vomiting, confusion, bleeding, hemoptysis.  Constipation is getting better, still hard, still bloating and anorexia.  Still pain in his left side.       Objective: Vitals:   02/07/20 [id number] 02/07/20 0528 02/07/20 0720  02/07/20 0827  BP:  136/76  127/78  Pulse:  72  70  Resp: 19 (!) 24 20 17   Temp:  97.8 F (36.6 C)  97.8 F (36.6 C)  TempSrc:  Oral  Oral  SpO2: 95% (!) 89% 92% 94%  Weight:  73 kg    Height:        Intake/Output Summary (Last 24 hours) at 02/07/2020 1059 Last data filed at 02/07/2020 0456 Gross per 24 hour  Intake --  Output 1025 ml  Net -1025 ml   Filed Weights   02/02/20 2231 02/05/20 0500 02/07/20 0528  Weight: 68.4  kg 69.5 kg 73 kg    Examination: General appearance: Adult male, lying in bed, watching television, appears uncomfortable.     HEENT: Anicteric, conjunctival pink, lids and lashes normal. Skin: No suspicious rashes or lesions, no jaundice Cardiac: RRR, systolic murmur noted, no lower extremity edema Respiratory: Respirations shallow, lung sounds clear, no wheezing.  Decreased respiratory effort due to pain. Abdomen: Abdomen soft without guarding.  He has diffuse tenderness, radiated from the left chest. MSK:  Neuro: Extraocular movements intact, moves all extremities with generalized weakness, normal coordination, speech fluent. Psych: Attention normal, affect normal, judgment (normal    Data Reviewed: I have personally reviewed following labs and imaging studies:  CBC: Recent Labs  Lab 02/02/20 1339 02/02/20 1339 02/04/20 0027 02/05/20 0454 02/05/20 0457 02/06/20 0056 02/07/20 0110  WBC 28.0*  --  32.2* 29.8*  --  31.1* 20.6*  NEUTROABS 23.8*  --   --   --   --   --   --   HGB 10.5*   < > 8.8* 8.9* 9.2* 8.7* 9.2*  HCT 32.9*   < > 27.1* 28.3* 27.0* 27.3* 28.8*  MCV 91.1  --  89.4 92.2  --  89.2 88.3  PLT 500*  --  559* 548*  --  560* 637*   < > = values in this interval not displayed.   Basic Metabolic Panel: Recent Labs  Lab 02/02/20 1339 02/02/20 1339 02/04/20 0027 02/05/20 0454 02/05/20 0457 02/06/20 0056 02/07/20 0110  NA 136   < > 136 138 142 137 140  K 3.6   < > 3.7 3.6 3.4* 3.2* 3.6  CL 102  --  108 110  --  110 109  CO2 23  --  19* 19*  --  18* 22  GLUCOSE 117*  --  106* 171*  --  156* 79  BUN 29*  --  21 25*  --  24* 16  CREATININE 1.11  --  0.95 0.68  --  0.71 0.57*  CALCIUM 8.6*  --  7.9* 8.1*  --  7.8* 7.8*  MG  --   --   --   --   --   --  1.9   < > = values in this interval not displayed.   GFR: Estimated Creatinine Clearance: 90.7 mL/min (A) (by C-G formula based on SCr of 0.57 mg/dL (L)). Liver Function Tests: Recent Labs  Lab  02/02/20 1339 02/02/20 1903 02/04/20 0027  AST 28  --  35  ALT 17  --  31  ALKPHOS 140*  --  147*  BILITOT 1.0  --  0.9  PROT 7.4 6.2* 5.5*  ALBUMIN 2.6* 2.0* 1.6*   No results for input(s): LIPASE, AMYLASE in the last 168 hours. No results for input(s): AMMONIA in the last 168 hours. Coagulation Profile: Recent Labs  Lab 02/02/20 1903  INR 1.3*  Cardiac Enzymes: No results for input(s): CKTOTAL, CKMB, CKMBINDEX, TROPONINI in the last 168 hours. BNP (last 3 results) No results for input(s): PROBNP in the last 8760 hours. HbA1C: No results for input(s): HGBA1C in the last 72 hours. CBG: Recent Labs  Lab 02/05/20 1601 02/05/20 2034 02/05/20 2350 02/06/20 0408 02/06/20 0758  GLUCAP 174* 133* 142* 131* 145*   Lipid Profile: No results for input(s): CHOL, HDL, LDLCALC, TRIG, CHOLHDL, LDLDIRECT in the last 72 hours. Thyroid Function Tests: No results for input(s): TSH, T4TOTAL, FREET4, T3FREE, THYROIDAB in the last 72 hours. Anemia Panel: Recent Labs    02/05/20 0454  VITAMINB12 477  TIBC 104*  IRON 14*  RETICCTPCT 1.0   Urine analysis:    Component Value Date/Time   COLORURINE YELLOW 02/02/2020 1640   APPEARANCEUR HAZY (A) 02/02/2020 1640   LABSPEC 1.020 02/02/2020 1640   PHURINE 5.0 02/02/2020 1640   GLUCOSEU NEGATIVE 02/02/2020 1640   HGBUR NEGATIVE 02/02/2020 1640   BILIRUBINUR NEGATIVE 02/02/2020 1640   KETONESUR 5 (A) 02/02/2020 1640   PROTEINUR 30 (A) 02/02/2020 1640   NITRITE NEGATIVE 02/02/2020 1640   LEUKOCYTESUR NEGATIVE 02/02/2020 1640   Sepsis Labs: (procalcitonin:4,lacticacidven:4)  ) Recent Results (from the past 240 hour(s))  Resp Panel by RT-PCR (Flu A&B, Covid) Nasopharyngeal Swab     Status: None   Collection Time: 02/02/20  3:21 PM   Specimen: Nasopharyngeal Swab; Nasopharyngeal(NP) swabs in vial transport medium  Result Value Ref Range Status   SARS Coronavirus 2 by RT PCR NEGATIVE NEGATIVE Final    Comment:  (NOTE) SARS-CoV-2 target nucleic acids are NOT DETECTED.  The SARS-CoV-2 RNA is generally detectable in upper respiratory specimens during the acute phase of infection. The lowest concentration of SARS-CoV-2 viral copies this assay can detect is 138 copies/mL. A negative result does not preclude SARS-Cov-2 infection and should not be used as the sole basis for treatment or other patient management decisions. A negative result may occur with  improper specimen collection/handling, submission of specimen other than nasopharyngeal swab, presence of viral mutation(s) within the areas targeted by this assay, and inadequate number of viral copies(<138 copies/mL). A negative result must be combined with clinical observations, patient history, and epidemiological information. The expected result is Negative.  Fact Sheet for Patients:  BloggerCourse.com  Fact Sheet for Healthcare Providers:  SeriousBroker.it  This test is no t yet approved or cleared by the [place] FDA and  has been authorized for detection and/or diagnosis of SARS-CoV-2 by FDA under an Emergency Use Authorization (EUA). This EUA will remain  in effect (meaning this test can be used) for the duration of the COVID-19 declaration under Section 564(b)(1) of the Act, 21 U.S.C.section 360bbb-3(b)(1), unless the authorization is terminated  or revoked sooner.       Influenza A by PCR NEGATIVE NEGATIVE Final   Influenza B by PCR NEGATIVE NEGATIVE Final    Comment: (NOTE) The Xpert Xpress SARS-CoV-2/FLU/RSV plus assay is intended as an aid in the diagnosis of influenza from Nasopharyngeal swab specimens and should not be used as a sole basis for treatment. Nasal washings and aspirates are unacceptable for Xpert Xpress SARS-CoV-2/FLU/RSV testing.  Fact Sheet for Patients: BloggerCourse.com  Fact Sheet for Healthcare  Providers: SeriousBroker.it  This test is not yet approved or cleared by the [place] FDA and has been authorized for detection and/or diagnosis of SARS-CoV-2 by FDA under an Emergency Use Authorization (EUA). This EUA will remain in effect (meaning this test can be used)  for the duration of the COVID-19 declaration under Section 564(b)(1) of the Act, 21 U.S.C. section 360bbb-3(b)(1), unless the authorization is terminated or revoked.  Performed at [facility], [address], [place], [place] [address]   Blood Culture (routine x 2)     Status: None (Preliminary result)   Collection Time: 02/02/20  3:41 PM   Specimen: BLOOD  Result Value Ref Range Status   Specimen Description BLOOD  Final   Special Requests NONE  Final   Culture   Final    NO GROWTH 4 DAYS Performed at [facility], [address] [place], [place] [address]    Report Status PENDING  Incomplete  Blood Culture (routine x 2)     Status: None (Preliminary result)   Collection Time: 02/02/20  3:41 PM   Specimen: BLOOD  Result Value Ref Range Status   Specimen Description BLOOD LEFT ANTECUBITAL  Final   Special Requests   Final    BOTTLES DRAWN AEROBIC AND ANAEROBIC Blood Culture adequate volume   Culture   Final    NO GROWTH 4 DAYS Performed at [facility], [address] [place], [place] [address]    Report Status PENDING  Incomplete  Urine culture     Status: None   Collection Time: 02/02/20  4:40 PM   Specimen: Urine, Clean Catch  Result Value Ref Range Status   Specimen Description   Final    URINE, CLEAN CATCH Performed at [facility], [address], [place], [place] [address]    Special Requests   Final    NONE Performed at [facility], [address], [place], [place] [address]    Culture   Final    NO GROWTH Performed at [facility], [address], [place], [place] [address]    Report Status 02/04/2020 FINAL  Final  Fungus Culture With Stain     Status:  None (Preliminary result)   Collection Time: 02/02/20  5:40 PM   Specimen: Lung; Pleural Fluid  Result Value Ref Range Status   Fungus Stain Final report  Final    Comment: (NOTE) Performed At: [facility] [address] [place], [place] [id number] Jolene Schimke MD BM:[id number]    Fungus (Mycology) Culture PENDING  Incomplete   Fungal Source PLEURAL  Final    Comment: Performed at [facility], [address] [place], [place] [address]  Culture, body fluid-bottle     Status: Abnormal   Collection Time: 02/02/20  5:40 PM   Specimen: Pleura  Result Value Ref Range Status   Specimen Description   Final    PLEURAL Performed at [facility], [address], [place], [place] [address]    Special Requests   Final    BOTTLES DRAWN AEROBIC AND ANAEROBIC 10CC Performed at [facility], [address] [place], [place] [address]    Gram Stain   Final    GRAM POSITIVE COCCI IN BOTH AEROBIC AND ANAEROBIC BOTTLES Gram Stain Report Called to,Read Back By and Verified With: Kennon Portela  02/02/20 [facility] Performed at [facility], [address], [place], [place] [address]    Culture STREPTOCOCCUS CONSTELLATUS (A)  Final   Report Status 02/05/2020 FINAL  Final   Organism ID, Bacteria STREPTOCOCCUS CONSTELLATUS  Final      Susceptibility   Streptococcus constellatus - MIC*    PENICILLIN INTERMEDIATE Intermediate     CEFTRIAXONE 1 SENSITIVE Sensitive     ERYTHROMYCIN <=0.12 SENSITIVE Sensitive     LEVOFLOXACIN <=0.25  SENSITIVE Sensitive     VANCOMYCIN 0.25 SENSITIVE Sensitive     * STREPTOCOCCUS CONSTELLATUS  Gram stain     Status: None   Collection Time: 02/02/20  5:40 PM   Specimen: Pleura  Result Value Ref Range Status   Specimen Description PLEURAL  Final   Special Requests NONE  Final   Gram Stain   Final    ABUNDANT GRAM POSITIVE COCCI RARE GRAM POSITIVE RODS WBC PRESENT, PREDOMINANTLY MONONUCLEAR Gram Stain Report Called to,Read Back By and Verified With: TONI  [email or web address]  02/02/20 BY JONES,T Performed at [facility], [address] [place], [place] [address]    Report Status 02/02/2020 FINAL  Final  Fungus Culture Result     Status: None   Collection Time: 02/02/20  5:40 PM  Result Value Ref Range Status   Result 1 Comment  Final    Comment: (NOTE) KOH/Calcofluor preparation:  no fungus observed. Performed At: [facility] [address] [place], [place] [id number] Jolene Schimke MD EX:[id number]   Surgical pcr screen     Status: None   Collection Time: 02/02/20 10:39 PM   Specimen: Nasal Mucosa; Nasal Swab  Result Value Ref Range Status   MRSA, PCR NEGATIVE NEGATIVE Final   Staphylococcus aureus NEGATIVE NEGATIVE Final    Comment: (NOTE) The Xpert SA Assay (FDA approved for NASAL specimens in patients 77 years of age and older), is one component of a comprehensive surveillance program. It is not intended to diagnose infection nor to guide or monitor treatment. Performed at [facility], [address], [place], [place] [address]   Aerobic/Anaerobic Culture (surgical/deep wound)     Status: None (Preliminary result)   Collection Time: 02/04/20  4:32 PM   Specimen: PATH Other; Body Fluid  Result Value Ref Range Status   Specimen Description FLUID PLEURAL LEFT  Final   Special Requests NONE  Final   Gram Stain   Final    MODERATE WBC PRESENT,BOTH PMN AND MONONUCLEAR ABUNDANT GRAM POSITIVE COCCI MODERATE GRAM NEGATIVE RODS Performed at [facility], [address], [place], [place] [address]    Culture RARE VIRIDANS STREPTOCOCCUS  Final   Report Status PENDING  Incomplete   Organism ID, Bacteria VIRIDANS STREPTOCOCCUS  Final      Susceptibility   Viridans streptococcus - MIC*    PENICILLIN INTERMEDIATE Intermediate     CEFTRIAXONE 2 INTERMEDIATE Intermediate     ERYTHROMYCIN <=0.12 SENSITIVE Sensitive     LEVOFLOXACIN 0.5 SENSITIVE Sensitive     VANCOMYCIN 0.25 SENSITIVE Sensitive     * RARE VIRIDANS  STREPTOCOCCUS  Aerobic/Anaerobic Culture (surgical/deep wound)     Status: None (Preliminary result)   Collection Time: 02/04/20  5:21 PM   Specimen: PATH Soft tissue resection  Result Value Ref Range Status   Specimen Description TISSUE PLEURAL  Final   Special Requests PLEURAL PEEL PT ON VANC  Final   Gram Stain   Final    FEW WBC PRESENT,BOTH PMN AND MONONUCLEAR MODERATE GRAM POSITIVE COCCI FEW GRAM NEGATIVE RODS Performed at [facility], [address], [place], [place] [address]    Culture   Final    RARE VIRIDANS STREPTOCOCCUS NO ANAEROBES ISOLATED; CULTURE IN PROGRESS FOR 5 DAYS    Report Status PENDING  Incomplete         Radiology Studies: DG CHEST PORT 1 VIEW  Result Date: 02/07/2020 CLINICAL DATA:  Chest tube.  Empyema. EXAM: PORTABLE CHEST 1 VIEW COMPARISON:  February 06, 2020. FINDINGS: Stable cardiomediastinal silhouette. Two left-sided chest tubes are unchanged without pneumothorax. Stable bilateral lung opacities are noted concerning for pneumonia, left greater than right. Bony thorax is unremarkable. IMPRESSION: Stable bilateral lung opacities are noted concerning for pneumonia, left greater than right. Two left-sided chest tubes are unchanged without pneumothorax. Electronically Signed   By: Lupita Raider M.D.   On: 02/07/2020 08:25   DG Chest Port 1 View  Result Date: 02/06/2020 CLINICAL DATA:  Chest tubes in place.  Status post thoracotomy EXAM: PORTABLE CHEST 1 VIEW COMPARISON:  February 05, 2020 FINDINGS: Chest tube positions are unchanged without pneumothorax. Persistent small loculated pleural effusion on the left with areas of ill-defined opacity in portions of the left mid and lower lung regions, stable. Patchy infiltrate right upper lobe remains. No appreciable new opacity. Heart is upper normal in size with pulmonary vascularity normal. No adenopathy. Old healed clavicle fracture on the right, stable. IMPRESSION: Chest tube positions on the left  unchanged. No pneumothorax. Partially loculated left pleural effusion with patchy airspace opacity in portions of the left lung, stable. Ill-defined opacity right upper lobe persists. No new opacity evident. Stable cardiac silhouette. Electronically Signed   By: Bretta Bang III M.D.   On: 02/06/2020 07:54        Scheduled Meds: . acetaminophen  1,000 mg Oral Q6H   Or  . acetaminophen (TYLENOL) oral liquid 160 mg/5 mL  1,000 mg Oral Q6H  . Chlorhexidine Gluconate Cloth  6 each Topical Daily  . folic acid  1 mg Oral Daily  . guaiFENesin  600 mg Oral BID  . mouth rinse  15 mL Mouth Rinse BID  . morphine   Intravenous Q4H  . multivitamin with minerals  1 tablet Oral Daily  . nicotine  14 mg Transdermal Daily  . polyethylene glycol  17 g Oral Daily  . senna-docusate  1 tablet Oral QHS   Continuous Infusions: . cefTRIAXone (ROCEPHIN)  IV 1 g (02/06/20 1555)     LOS: 5 days    Time spent: 25 minutes    Alberteen Sam, MD [facility] 02/07/2020, 10:59 AM     Please page though AMION or Epic secure chat:  For [employer], Higher education careers adviser

## 2020-02-07 NOTE — Plan of Care (Signed)
  Problem: Health Behavior/Discharge Planning: Goal: Ability to manage health-related needs will improve Outcome: Not Progressing   Problem: Clinical Measurements: Goal: Ability to maintain clinical measurements within normal limits will improve Outcome: Not Progressing Goal: Will remain free from infection Outcome: Not Progressing Goal: Diagnostic test results will improve Outcome: Not Progressing Goal: Respiratory complications will improve Outcome: Not Progressing Goal: Cardiovascular complication will be avoided Outcome: Not Progressing   Problem: Education: Goal: Knowledge of General Education information will improve Description: Including pain rating scale, medication(s)/side effects and non-pharmacologic comfort measures Outcome: Not Progressing   Problem: Activity: Goal: Risk for activity intolerance will decrease Outcome: Not Progressing   Problem: Nutrition: Goal: Adequate nutrition will be maintained Outcome: Not Progressing   Problem: Coping: Goal: Level of anxiety will decrease Outcome: Not Progressing   Problem: Elimination: Goal: Will not experience complications related to bowel motility Outcome: Not Progressing Goal: Will not experience complications related to urinary retention Outcome: Not Progressing   Problem: Pain Managment: Goal: General experience of comfort will improve Outcome: Not Progressing   Problem: Safety: Goal: Ability to remain free from injury will improve Outcome: Not Progressing   Problem: Skin Integrity: Goal: Risk for impaired skin integrity will decrease Outcome: Not Progressing

## 2020-02-07 NOTE — Plan of Care (Signed)

## 2020-02-08 DIAGNOSIS — A419 Sepsis, unspecified organism: Secondary | ICD-10-CM | POA: Diagnosis not present

## 2020-02-08 DIAGNOSIS — R6520 Severe sepsis without septic shock: Secondary | ICD-10-CM | POA: Diagnosis not present

## 2020-02-08 LAB — CBC
HCT: 30.2 % — ABNORMAL LOW (ref 39.0–52.0)
Hemoglobin: 9.6 g/dL — ABNORMAL LOW (ref 13.0–17.0)
MCH: 28.2 pg (ref 26.0–34.0)
MCHC: 31.8 g/dL (ref 30.0–36.0)
MCV: 88.8 fL (ref 80.0–100.0)
Platelets: 660 10*3/uL — ABNORMAL HIGH (ref 150–400)
RBC: 3.4 MIL/uL — ABNORMAL LOW (ref 4.22–5.81)
RDW: 14.3 % (ref 11.5–15.5)
WBC: 21.7 10*3/uL — ABNORMAL HIGH (ref 4.0–10.5)
nRBC: 0 % (ref 0.0–0.2)

## 2020-02-08 LAB — BASIC METABOLIC PANEL
Anion gap: 10 (ref 5–15)
BUN: 12 mg/dL (ref 8–23)
CO2: 25 mmol/L (ref 22–32)
Calcium: 7.7 mg/dL — ABNORMAL LOW (ref 8.9–10.3)
Chloride: 101 mmol/L (ref 98–111)
Creatinine, Ser: 0.6 mg/dL — ABNORMAL LOW (ref 0.61–1.24)
GFR, Estimated: >60 mL/min (ref >60)
Glucose, Bld: 86 mg/dL (ref 70–99)
Potassium: 3.6 mmol/L (ref 3.5–5.1)
Sodium: 136 mmol/L (ref 135–145)

## 2020-02-08 MED ORDER — KETOROLAC TROMETHAMINE 15 MG/ML IJ SOLN
15.0000 mg | Freq: Four times a day (QID) | INTRAMUSCULAR | Status: AC | PRN
  Administered 2020-02-08 – 2020-02-09 (×3): 15 mg via INTRAVENOUS
  Filled 2020-02-08 (×3): qty 1

## 2020-02-08 NOTE — Progress Notes (Signed)
[facility] PROGRESS NOTE    Francisco Pittman  PPJ:[id number] DOB: [birth year] DOA: 02/02/2020 PCP: Patient, No Pcp Per      Brief Narrative:  Francisco Pittman is a 59 y.o. M with smoking, EtOH use who presents with non-resolving pain, cough, malaise after recent pneumonia.  Patient admitted to APS last month for multifocal pneumonia, treated with IV antibiotics, and discharged with oral antibiotics.  After completion of oral antibiotics, he continued to have severe left-sided pain, malaise, chills, and cough, and so he returned to the ER.  On readmission, CT of the chest showed a large loculated pleural effusion, progressed from previous.  Thoracentesis was obtained and about 200 cc of purulent foul-smelling fluid returned, which had polymicrobial Gram stain.  Case was discussed with CT surgery, who appear to have recommended transfer for VATS.          Assessment & Plan:  Empyema S/p VATS 11/26 by Dr. Lavinia Sharps -Continue Levaquin  -Consult CT surgery, appreciate recommendations -Chest tube management per CT surgery      Smoking Smoking cessation recommended, modalities discussed  Suspected COPD This is a likely although unconfirmed diagnosis in this longterm smoker.  No more wheezing noted, not in acute flare  -Continue Albuterol as needed  History of alcohol use -Continue folate and thiamine   Anemia of chronic disease  Hypokalemia    Disposition: Status is: Inpatient  Remains inpatient appropriate because:Ongoing diagnostic testing needed not appropriate for outpatient work up   Dispo: The patient is from: Home              Anticipated d/c is to: Home              Anticipated d/c date is: > 3 days              Patient currently is not medically stable to d/c.     Patient admitted with empyema.  VATS completed, chest tube in place.  PT evaluation pending, as well as chest tube removal, then either home or to SNF.  Dispo per CT  surgery.         MDM: The below labs and imaging reports were reviewed and summarized above.  Medication management as above.    DVT prophylaxis: SCDs  Code Status: Full code Family Communication: Wife at bedside    Consultants:   CT surgery  Procedures:   11/24 thoracentesis  11/26 VATS  Antimicrobials:   Vancomycin and cefepime 11/24>>  Flagyl 11/25>>  Culture data:   Blood cultures 1/24-no growth to date  Body fluid culture from effusion/thoracentesis 11/24-6 Streptococcus constellatus  Body fluid culture from VATS -- lab calling "Viridans", this is same bug as thora culture          Subjective: No fever, dyspnea, wheezing vomiting, confusion.  He is coughing up some green stuff, and still has pain in his left side, still pretty constipated.       Objective: Vitals:   02/08/20 0732 02/08/20 0805 02/08/20 1134 02/08/20 1143  BP:  (!) 142/71  119/69  Pulse:  85  91  Resp: 18 16 16 16   Temp:  98.4 F (36.9 C)  97.7 F (36.5 C)  TempSrc:  Oral  Axillary  SpO2: 91% 90% 91% 90%  Weight:      Height:        Intake/Output Summary (Last 24 hours) at 02/08/2020 1424 Last data filed at 02/08/2020 0806 Gross per 24 hour  Intake 240 ml  Output 1545  ml  Net -1305 ml   Filed Weights   02/02/20 2231 02/05/20 0500 02/07/20 0528  Weight: 68.4 kg 69.5 kg 73 kg    Examination: General appearance: Adult male, lying in bed, watching TV, no acute distress     HEENT:    Skin:  Cardiac: Regular rate and rhythm, no lower extremity edema, JVP normal Respiratory: Respirations shallow, lungs without wheezing or rales that I appreciate, decreased respiratory effort due to pain. Abdomen: Abdomen soft without tenderness palpation or guarding. MSK:  Neuro: Extractor movements intact, moves all extremities with generalized weakness but normal coordination, speech fluent. Psych: Attention normal, affect normal, judgment insight appear  normal     Data Reviewed: I have personally reviewed following labs and imaging studies:  CBC: Recent Labs  Lab 02/02/20 1339 02/02/20 1339 02/04/20 0027 02/04/20 0027 02/05/20 0454 02/05/20 0457 02/06/20 0056 02/07/20 0110 02/08/20 0054  WBC 28.0*   < > 32.2*  --  29.8*  --  31.1* 20.6* 21.7*  NEUTROABS 23.8*  --   --   --   --   --   --   --   --   HGB 10.5*   < > 8.8*   < > 8.9* 9.2* 8.7* 9.2* 9.6*  HCT 32.9*   < > 27.1*   < > 28.3* 27.0* 27.3* 28.8* 30.2*  MCV 91.1   < > 89.4  --  92.2  --  89.2 88.3 88.8  PLT 500*   < > 559*  --  548*  --  560* 637* 660*   < > = values in this interval not displayed.   Basic Metabolic Panel: Recent Labs  Lab 02/04/20 0027 02/04/20 0027 02/05/20 0454 02/05/20 0457 02/06/20 0056 02/07/20 0110 02/08/20 0054  NA 136   < > 138 142 137 140 136  K 3.7   < > 3.6 3.4* 3.2* 3.6 3.6  CL 108  --  110  --  110 109 101  CO2 19*  --  19*  --  18* 22 25  GLUCOSE 106*  --  171*  --  156* 79 86  BUN 21  --  25*  --  24* 16 12  CREATININE 0.95  --  0.68  --  0.71 0.57* 0.60*  CALCIUM 7.9*  --  8.1*  --  7.8* 7.8* 7.7*  MG  --   --   --   --   --  1.9  --    < > = values in this interval not displayed.   GFR: Estimated Creatinine Clearance: 90.7 mL/min (A) (by C-G formula based on SCr of 0.6 mg/dL (L)). Liver Function Tests: Recent Labs  Lab 02/02/20 1339 02/02/20 1903 02/04/20 0027  AST 28  --  35  ALT 17  --  31  ALKPHOS 140*  --  147*  BILITOT 1.0  --  0.9  PROT 7.4 6.2* 5.5*  ALBUMIN 2.6* 2.0* 1.6*   No results for input(s): LIPASE, AMYLASE in the last 168 hours. No results for input(s): AMMONIA in the last 168 hours. Coagulation Profile: Recent Labs  Lab 02/02/20 1903  INR 1.3*   Cardiac Enzymes: No results for input(s): CKTOTAL, CKMB, CKMBINDEX, TROPONINI in the last 168 hours. BNP (last 3 results) No results for input(s): PROBNP in the last 8760 hours. HbA1C: No results for input(s): HGBA1C in the last 72  hours. CBG: Recent Labs  Lab 02/05/20 1601 02/05/20 2034 02/05/20 2350 02/06/20 0408 02/06/20 0758  GLUCAP  174* 133* 142* 131* 145*   Lipid Profile: No results for input(s): CHOL, HDL, LDLCALC, TRIG, CHOLHDL, LDLDIRECT in the last 72 hours. Thyroid Function Tests: No results for input(s): TSH, T4TOTAL, FREET4, T3FREE, THYROIDAB in the last 72 hours. Anemia Panel: No results for input(s): VITAMINB12, FOLATE, FERRITIN, TIBC, IRON, RETICCTPCT in the last 72 hours. Urine analysis:    Component Value Date/Time   COLORURINE YELLOW 02/02/2020 1640   APPEARANCEUR HAZY (A) 02/02/2020 1640   LABSPEC 1.020 02/02/2020 1640   PHURINE 5.0 02/02/2020 1640   GLUCOSEU NEGATIVE 02/02/2020 1640   HGBUR NEGATIVE 02/02/2020 1640   BILIRUBINUR NEGATIVE 02/02/2020 1640   KETONESUR 5 (A) 02/02/2020 1640   PROTEINUR 30 (A) 02/02/2020 1640   NITRITE NEGATIVE 02/02/2020 1640   LEUKOCYTESUR NEGATIVE 02/02/2020 1640   Sepsis Labs: (procalcitonin:4,lacticacidven:4)  ) Recent Results (from the past 240 hour(s))  Resp Panel by RT-PCR (Flu A&B, Covid) Nasopharyngeal Swab     Status: None   Collection Time: 02/02/20  3:21 PM   Specimen: Nasopharyngeal Swab; Nasopharyngeal(NP) swabs in vial transport medium  Result Value Ref Range Status   SARS Coronavirus 2 by RT PCR NEGATIVE NEGATIVE Final    Comment: (NOTE) SARS-CoV-2 target nucleic acids are NOT DETECTED.  The SARS-CoV-2 RNA is generally detectable in upper respiratory specimens during the acute phase of infection. The lowest concentration of SARS-CoV-2 viral copies this assay can detect is 138 copies/mL. A negative result does not preclude SARS-Cov-2 infection and should not be used as the sole basis for treatment or other patient management decisions. A negative result may occur with  improper specimen collection/handling, submission of specimen other than nasopharyngeal swab, presence of viral mutation(s) within the areas targeted  by this assay, and inadequate number of viral copies(<138 copies/mL). A negative result must be combined with clinical observations, patient history, and epidemiological information. The expected result is Negative.  Fact Sheet for Patients:  BloggerCourse.com  Fact Sheet for Healthcare Providers:  SeriousBroker.it  This test is no t yet approved or cleared by the [place] FDA and  has been authorized for detection and/or diagnosis of SARS-CoV-2 by FDA under an Emergency Use Authorization (EUA). This EUA will remain  in effect (meaning this test can be used) for the duration of the COVID-19 declaration under Section 564(b)(1) of the Act, 21 U.S.C.section 360bbb-3(b)(1), unless the authorization is terminated  or revoked sooner.       Influenza A by PCR NEGATIVE NEGATIVE Final   Influenza B by PCR NEGATIVE NEGATIVE Final    Comment: (NOTE) The Xpert Xpress SARS-CoV-2/FLU/RSV plus assay is intended as an aid in the diagnosis of influenza from Nasopharyngeal swab specimens and should not be used as a sole basis for treatment. Nasal washings and aspirates are unacceptable for Xpert Xpress SARS-CoV-2/FLU/RSV testing.  Fact Sheet for Patients: BloggerCourse.com  Fact Sheet for Healthcare Providers: SeriousBroker.it  This test is not yet approved or cleared by the [place] FDA and has been authorized for detection and/or diagnosis of SARS-CoV-2 by FDA under an Emergency Use Authorization (EUA). This EUA will remain in effect (meaning this test can be used) for the duration of the COVID-19 declaration under Section 564(b)(1) of the Act, 21 U.S.C. section 360bbb-3(b)(1), unless the authorization is terminated or revoked.  Performed at [facility], [address] [place], [place] [address]   Blood Culture (routine x 2)     Status: None   Collection Time: 02/02/20  3:41 PM    Specimen: BLOOD  Result  Value Ref Range Status   Specimen Description BLOOD  Final   Special Requests NONE  Final   Culture   Final    NO GROWTH 5 DAYS Performed at [facility], [address] [place], [place] [address]    Report Status 02/07/2020 FINAL  Final  Blood Culture (routine x 2)     Status: None   Collection Time: 02/02/20  3:41 PM   Specimen: BLOOD  Result Value Ref Range Status   Specimen Description BLOOD LEFT ANTECUBITAL  Final   Special Requests   Final    BOTTLES DRAWN AEROBIC AND ANAEROBIC Blood Culture adequate volume   Culture   Final    NO GROWTH 5 DAYS Performed at [facility], [address] [place], [place] [address]    Report Status 02/07/2020 FINAL  Final  Urine culture     Status: None   Collection Time: 02/02/20  4:40 PM   Specimen: Urine, Clean Catch  Result Value Ref Range Status   Specimen Description   Final    URINE, CLEAN CATCH Performed at [facility], [address] [place], [place] [address]    Special Requests   Final    NONE Performed at [facility], [address] [place], [place] [address]    Culture   Final    NO GROWTH Performed at [facility], [address] [place], [place] [address]    Report Status 02/04/2020 FINAL  Final  Fungus Culture With Stain     Status: None (Preliminary result)   Collection Time: 02/02/20  5:40 PM   Specimen: Lung; Pleural Fluid  Result Value Ref Range Status   Fungus Stain Final report  Final    Comment: (NOTE) Performed At: [facility] [address] [place], [place] [id number] Jolene SchimkeNagendra Sanjai MD ZD:[id number]:[phone]    Fungus (Mycology) Culture PENDING  Incomplete   Fungal Source PLEURAL  Final    Comment: Performed at [facility], [address] [place], [place] [address]  Culture, body fluid-bottle     Status: Abnormal   Collection Time: 02/02/20  5:40 PM   Specimen: Pleura  Result Value Ref Range Status   Specimen Description   Final    PLEURAL Performed at [facility], [address] [place], [place] [address]    Special Requests   Final    BOTTLES DRAWN AEROBIC AND ANAEROBIC 10CC Performed at [facility], [address] [place], [place] [address]    Gram Stain   Final    GRAM POSITIVE COCCI IN BOTH AEROBIC AND ANAEROBIC BOTTLES Gram Stain Report Called to,Read Back By and Verified With: T Arlis PortaWALKER,RN @2140  02/02/20 MKELLY Performed at [facility], [address] [place], [place] [address]    Culture STREPTOCOCCUS CONSTELLATUS (A)  Final   Report Status 02/05/2020 FINAL  Final   Organism ID, Bacteria STREPTOCOCCUS CONSTELLATUS  Final      Susceptibility   Streptococcus constellatus - MIC*    PENICILLIN INTERMEDIATE Intermediate     CEFTRIAXONE 1 SENSITIVE Sensitive     ERYTHROMYCIN <=0.12 SENSITIVE Sensitive     LEVOFLOXACIN <=0.25 SENSITIVE Sensitive     VANCOMYCIN 0.25 SENSITIVE Sensitive     * STREPTOCOCCUS CONSTELLATUS  Gram stain     Status: None   Collection Time: 02/02/20  5:40 PM   Specimen: Pleura  Result Value Ref Range Status   Specimen Description PLEURAL  Final   Special Requests NONE  Final   Gram Stain   Final  ABUNDANT GRAM POSITIVE COCCI RARE GRAM POSITIVE RODS WBC PRESENT, PREDOMINANTLY MONONUCLEAR Gram Stain Report Called to,Read Back By and Verified With: TONI WALKER@1936  02/02/20 BY JONES,T Performed at [facility], [address] [place], [place] [address]    Report Status 02/02/2020 FINAL  Final  Fungus Culture Result     Status: None   Collection Time: 02/02/20  5:40 PM  Result Value Ref Range Status   Result 1 Comment  Final    Comment: (NOTE) KOH/Calcofluor preparation:  no fungus observed. Performed At: [facility] [address] [place], [place] [id number] Jolene Schimke MD OT:[id number]   Surgical pcr screen     Status: None   Collection Time: 02/02/20 10:39 PM   Specimen: Nasal Mucosa; Nasal Swab  Result Value Ref Range Status   MRSA, PCR NEGATIVE NEGATIVE Final   Staphylococcus aureus  NEGATIVE NEGATIVE Final    Comment: (NOTE) The Xpert SA Assay (FDA approved for NASAL specimens in patients 58 years of age and older), is one component of a comprehensive surveillance program. It is not intended to diagnose infection nor to guide or monitor treatment. Performed at [facility], [address] [place], [place] [address]   Aerobic/Anaerobic Culture (surgical/deep wound)     Status: None (Preliminary result)   Collection Time: 02/04/20  4:32 PM   Specimen: PATH Other; Body Fluid  Result Value Ref Range Status   Specimen Description FLUID PLEURAL LEFT  Final   Special Requests NONE  Final   Gram Stain   Final    MODERATE WBC PRESENT,BOTH PMN AND MONONUCLEAR ABUNDANT GRAM POSITIVE COCCI MODERATE GRAM NEGATIVE RODS Performed at [facility], [address] [place], [place] [address]    Culture   Final    RARE VIRIDANS STREPTOCOCCUS NO ANAEROBES ISOLATED; CULTURE IN PROGRESS FOR 5 DAYS    Report Status PENDING  Incomplete   Organism ID, Bacteria VIRIDANS STREPTOCOCCUS  Final      Susceptibility   Viridans streptococcus - MIC*    PENICILLIN INTERMEDIATE Intermediate     CEFTRIAXONE 2 INTERMEDIATE Intermediate     ERYTHROMYCIN <=0.12 SENSITIVE Sensitive     LEVOFLOXACIN 0.5 SENSITIVE Sensitive     VANCOMYCIN 0.25 SENSITIVE Sensitive     * RARE VIRIDANS STREPTOCOCCUS  Acid Fast Smear (AFB)     Status: None   Collection Time: 02/04/20  4:32 PM   Specimen: PATH Other; Body Fluid  Result Value Ref Range Status   AFB Specimen Processing Concentration  Final   Acid Fast Smear Negative  Final    Comment: (NOTE) Performed At: [facility] [address] [place], [place] [id number] Jolene Schimke MD YQ:[id number]    Source (AFB) FLUID  Final    Comment: LEFT PLEURAL Performed at [facility], [address], [place], [place] [address]   Aerobic/Anaerobic Culture (surgical/deep wound)     Status: None (Preliminary result)   Collection  Time: 02/04/20  5:21 PM   Specimen: PATH Soft tissue resection  Result Value Ref Range Status   Specimen Description TISSUE PLEURAL  Final   Special Requests PLEURAL PEEL PT ON VANC  Final   Gram Stain   Final    FEW WBC PRESENT,BOTH PMN AND MONONUCLEAR MODERATE GRAM POSITIVE COCCI FEW GRAM NEGATIVE RODS Performed at [facility], [address], [place], [place] [address]    Culture   Final    RARE VIRIDANS STREPTOCOCCUS NO ANAEROBES ISOLATED; CULTURE IN PROGRESS FOR 5 DAYS  Report Status PENDING  Incomplete         Radiology Studies: DG CHEST PORT 1 VIEW  Result Date: 02/07/2020 CLINICAL DATA:  Chest tube.  Empyema. EXAM: PORTABLE CHEST 1 VIEW COMPARISON:  February 06, 2020. FINDINGS: Stable cardiomediastinal silhouette. Two left-sided chest tubes are unchanged without pneumothorax. Stable bilateral lung opacities are noted concerning for pneumonia, left greater than right. Bony thorax is unremarkable. IMPRESSION: Stable bilateral lung opacities are noted concerning for pneumonia, left greater than right. Two left-sided chest tubes are unchanged without pneumothorax. Electronically Signed   By: Lupita Raider M.D.   On: 02/07/2020 08:25        Scheduled Meds: . acetaminophen  1,000 mg Oral Q6H   Or  . acetaminophen (TYLENOL) oral liquid 160 mg/5 mL  1,000 mg Oral Q6H  . Chlorhexidine Gluconate Cloth  6 each Topical Daily  . folic acid  1 mg Oral Daily  . guaiFENesin  600 mg Oral BID  . mouth rinse  15 mL Mouth Rinse BID  . morphine   Intravenous Q4H  . multivitamin with minerals  1 tablet Oral Daily  . nicotine  14 mg Transdermal Daily  . polyethylene glycol  17 g Oral Daily  . senna-docusate  1 tablet Oral QHS   Continuous Infusions: . levofloxacin (LEVAQUIN) IV 750 mg (02/07/20 1535)     LOS: 6 days    Time spent: 25 minutes    Alberteen Sam, MD [facility] 02/08/2020, 2:24 PM     Please page though AMION or Epic secure chat:   For [employer], Higher education careers adviser

## 2020-02-08 NOTE — Progress Notes (Addendum)
      [address]       [place] [address]             [phone]      4 Days Post-Op Procedure(s) (LRB): LEFT THORACOTOMY, DRAINAGE OF EMPYEMA, CHEST TUBE INSERTION (Left) CHEST TUBE INSERTION (Left) Subjective: Feels okay today, having some pain around the chest tube  Objective: Vital signs in last 24 hours: Temp:  [97.7 F (36.5 C)-98 F (36.7 C)] 98 F (36.7 C) (11/30 0348) Pulse Rate:  [66-86] 74 (11/30 0348) Cardiac Rhythm: Normal sinus rhythm (11/29 1928) Resp:  [14-20] 18 (11/30 0732) BP: (113-135)/(61-83) 126/61 (11/30 0348) SpO2:  [89 %-94 %] 91 % (11/30 0732) FiO2 (%):  [29 %-31 %] 29 % (11/29 1928)     Intake/Output from previous day: 11/29 0701 - 11/30 0700 In: 0  Out: 1470 [Urine:1320; Chest Tube:150] Intake/Output this shift: No intake/output data recorded.  General appearance: alert, cooperative and no distress Heart: regular rate and rhythm, S1, S2 normal, no murmur, click, rub or gallop Lungs: clear to auscultation bilaterally Abdomen: soft, non-tender; bowel sounds normal; no masses,  no organomegaly Extremities: extremities normal, atraumatic, no cyanosis or edema Wound: clean and dry  Lab Results: Recent Labs    02/07/20 0110 02/08/20 0054  WBC 20.6* 21.7*  HGB 9.2* 9.6*  HCT 28.8* 30.2*  PLT 637* 660*   BMET:  Recent Labs    02/07/20 0110 02/08/20 0054  NA 140 136  K 3.6 3.6  CL 109 101  CO2 22 25  GLUCOSE 79 86  BUN 16 12  CREATININE 0.57* 0.60*  CALCIUM 7.8* 7.7*    PT/INR: No results for input(s): LABPROT, INR in the last 72 hours. ABG    Component Value Date/Time   PHART 7.371 02/05/2020 0457   HCO3 20.5 02/05/2020 0457   TCO2 22 02/05/2020 0457   ACIDBASEDEF 5.0 (H) 02/05/2020 0457   O2SAT 95.0 02/05/2020 0457   CBG (last 3)  Recent Labs    02/05/20 2350 02/06/20 0408 02/06/20 0758  GLUCAP 142* 131* 145*    Assessment/Plan: S/P Procedure(s) (LRB): LEFT THORACOTOMY, DRAINAGE OF  EMPYEMA, CHEST TUBE INSERTION (Left) CHEST TUBE INSERTION (Left)  1. CV- hemodynamically stable in NSR 2. Pulm- CT output 150cc today, tubes will need to remain in place until drainage stops, CXR  Showed: Stable bilateral lung opacities are noted concerning for pneumonia, left greater than right. Two left-sided chest tubes are unchanged without pneumothorax. 3. ID- afebrile, leukocytosis improved to 21.7, on Rocephin  4. Dispo- patient stable, continue current care per primary, toradol added for pain.     LOS: 6 days    Sharlene Dory 02/08/2020    Chart reviewed, patient examined, agree with above. Chest tube output is decreasing and becoming serous. Remains afebrile but WBC ct still 22K. Continue antibiotics, leave chest tubes in. Will repeat CXR in am.

## 2020-02-08 NOTE — Progress Notes (Signed)
2cc morphine pca wasted in steri with Herold Harms, RN

## 2020-02-09 ENCOUNTER — Inpatient Hospital Stay (HOSPITAL_COMMUNITY): Payer: Medicaid - Out of State

## 2020-02-09 DIAGNOSIS — D649 Anemia, unspecified: Secondary | ICD-10-CM

## 2020-02-09 DIAGNOSIS — J869 Pyothorax without fistula: Secondary | ICD-10-CM | POA: Diagnosis not present

## 2020-02-09 DIAGNOSIS — A419 Sepsis, unspecified organism: Secondary | ICD-10-CM | POA: Diagnosis not present

## 2020-02-09 DIAGNOSIS — J449 Chronic obstructive pulmonary disease, unspecified: Secondary | ICD-10-CM | POA: Diagnosis not present

## 2020-02-09 DIAGNOSIS — F1010 Alcohol abuse, uncomplicated: Secondary | ICD-10-CM

## 2020-02-09 LAB — CBC
HCT: 28 % — ABNORMAL LOW (ref 39.0–52.0)
Hemoglobin: 9 g/dL — ABNORMAL LOW (ref 13.0–17.0)
MCH: 28.6 pg (ref 26.0–34.0)
MCHC: 32.1 g/dL (ref 30.0–36.0)
MCV: 88.9 fL (ref 80.0–100.0)
Platelets: 647 10*3/uL — ABNORMAL HIGH (ref 150–400)
RBC: 3.15 MIL/uL — ABNORMAL LOW (ref 4.22–5.81)
RDW: 14.4 % (ref 11.5–15.5)
WBC: 24.2 10*3/uL — ABNORMAL HIGH (ref 4.0–10.5)
nRBC: 0.1 % (ref 0.0–0.2)

## 2020-02-09 LAB — BASIC METABOLIC PANEL
Anion gap: 9 (ref 5–15)
BUN: 10 mg/dL (ref 8–23)
CO2: 27 mmol/L (ref 22–32)
Calcium: 7.8 mg/dL — ABNORMAL LOW (ref 8.9–10.3)
Chloride: 101 mmol/L (ref 98–111)
Creatinine, Ser: 0.61 mg/dL (ref 0.61–1.24)
GFR, Estimated: >60 mL/min (ref >60)
Glucose, Bld: 100 mg/dL — ABNORMAL HIGH (ref 70–99)
Potassium: 3.8 mmol/L (ref 3.5–5.1)
Sodium: 137 mmol/L (ref 135–145)

## 2020-02-09 MED ORDER — THIAMINE HCL 100 MG PO TABS
100.0000 mg | ORAL_TABLET | Freq: Every day | ORAL | Status: DC
  Administered 2020-02-09 – 2020-02-16 (×8): 100 mg via ORAL
  Filled 2020-02-09 (×7): qty 1

## 2020-02-09 NOTE — Progress Notes (Signed)
Wasted from PCA pump Morphine 3 ml @ stericycle witnessed by 2 RN's (Maelani Yarbro RN & Idelia Salm RN).

## 2020-02-09 NOTE — Plan of Care (Signed)

## 2020-02-09 NOTE — Discharge Instructions (Signed)
Thoracotomy, Care After This sheet gives you information about how to care for yourself after your procedure. Your health care provider may also give you more specific instructions. If you have problems or questions, contact your health care provider. What can I expect after the procedure? After your procedure, it is common to have:  Pain and swelling around the incision area.  Pain when you breathe in (inhale).  Constipation.  Fatigue.  Loss of appetite.  Trouble sleeping.  Mood swings and depression. Follow these instructions at home: Preventing pneumonia  Take deep breaths or do breathing exercises as instructed by your health care provider.  Cough frequently. Coughing may cause discomfort, but it is important to clear mucus (phlegm) and expand your lungs. If coughing hurts, hold a pillow against your chest or place both hands flat on top of the incision (splinting) when you cough. This may help relieve discomfort.  Continue to use an incentive spirometer as directed. This is a tool that measures how well you fill your lungs with each breath.  Participate in pulmonary rehabilitation as directed. This is a program that combines education, exercise, and support from a team of specialists. The goal is to help you heal and return to normal activities as soon as possible. Medicines  Take over-the-counter or prescription medicines only as told by your health care provider.  If you have pain, take pain-relieving medicine before your pain becomes severe. This is important because if your pain is under control, you will be able to breathe and cough more comfortably.  If you were prescribed an antibiotic medicine, take it as told by your health care provider. Do not stop taking the antibiotic even if you start to feel better. Activity   Ask your health care provider what activities are safe for you.  Do not travel by airplane for 2 weeks after your chest tube is removed, or until  your health care provider says that this is safe.  Do not lift anything that is heavier than 10 lb (4.5 kg), or the limit that your health care provider tells you, until he or she says that it is safe.  Do not drive until your health care provider approves. ? Do not drive or use heavy machinery while taking prescription pain medicine. Incision care   Follow instructions from your health care provider about how to take care of your incision. Make sure you: ? Wash your hands with soap and water before you change your bandage (dressing). If soap and water are not available, use hand sanitizer. ? Change your dressing as told by your health care provider. ? Leave stitches (sutures), skin glue, or adhesive strips in place. These skin closures may need to stay in place for 2 weeks or longer. If adhesive strip edges start to loosen and curl up, you may trim the loose edges. Do not remove adhesive strips completely unless your health care provider tells you to do that.  Keep your dressing dry.  Check your incision area every day for signs of infection. Check for: ? More redness, swelling, or pain. ? More fluid or blood. ? Warmth. ? Pus or a bad smell. Bathing  Do not take baths, swim, or use a hot tub until your health care provider approves. You may take showers.  After your dressing has been removed, use soap and water to gently wash your incision area. Do not use anything else to clean your incision unless your health care provider tells you to do that. Eating  and drinking  Eat a healthy diet as instructed by your health care provider. A healthy diet includes plenty of fresh fruits and vegetables, whole grains, and low-fat (lean) proteins.  Drink enough fluid to keep your urine clear or pale yellow. General instructions  To prevent or treat constipation while you are taking prescription pain medicine, your health care provider may recommend that you: ? Take over-the-counter or  prescription medicines. ? Eat foods that are high in fiber, such as fresh fruits and vegetables, whole grains, and beans. ? Limit foods that are high in fat and processed sugars, such as fried and sweet foods.  Do not use any products that contain nicotine or tobacco, such as cigarettes and e-cigarettes. If you need help quitting, ask your health care provider.  Avoid secondhand smoke.  Wear compression stockings as told by your health care provider. These stockings help to prevent blood clots and reduce swelling in your legs.  If you have a chest tube, care for it as instructed.  Keep all follow-up visits as told by your health care provider. This is important. Contact a health care provider if:  You have more redness, swelling, or pain around your incision.  You have more fluid or blood coming from your incision.  Your incision feels warm to the touch.  You have pus or a bad smell coming from your incision.  You have a fever or chills.  Your heartbeat seems irregular.  You have nausea or vomiting.  You have muscle aches.  You are constipated. This may mean that you have: ? Fewer bowel movements in a week than normal. ? Difficulty having a bowel movement. ? Stools that are dry, hard, or larger than normal. Get help right away if:  You develop a rash.  You feel light-headed or feel like you are going to faint.  You have shortness of breath or trouble breathing.  You are confused.  You have trouble speaking.  You have vision problems.  You are not able to move.  You have numbness in your face, arms, or legs.  You lose consciousness.  You have a sudden, severe headache.  You feel weak.  You have chest pain.  You have pain that: ? Is severe. ? Gets worse, even with medicine. Summary  To prevent pneumonia, take deep breaths, do breathing exercises, and cough frequently, as instructed by your health care provider.  Do not drive until your health care  provider approves. Do not travel by airplane for 2 weeks after your chest tube is removed, or until your health care provider approves.  Check your incision area every day for signs of infection.  Eat a healthy diet that includes plenty of fresh fruits and vegetables, whole grains, and low-fat (lean) proteins. This information is not intended to replace advice given to you by your health care provider. Make sure you discuss any questions you have with your health care provider. Document Revised: 02/07/2017 Document Reviewed: 11/20/2015 Elsevier Patient Education  2020 [employer].

## 2020-02-09 NOTE — Progress Notes (Addendum)
      [address]       [place] [id number]             [phone]      5 Days Post-Op Procedure(s) (LRB): LEFT THORACOTOMY, DRAINAGE OF EMPYEMA, CHEST TUBE INSERTION (Left) CHEST TUBE INSERTION (Left) Subjective: No complaints this morning.  Objective: Vital signs in last 24 hours: Temp:  [97.7 F (36.5 C)-98.6 F (37 C)] 98.6 F (37 C) (12/01 0727) Pulse Rate:  [76-91] 77 (12/01 0727) Cardiac Rhythm: Normal sinus rhythm (12/01 0716) Resp:  [15-20] 17 (12/01 0727) BP: (108-142)/(58-77) 125/58 (12/01 0727) SpO2:  [89 %-95 %] 91 % (12/01 0727)     Intake/Output from previous day: 11/30 0701 - 12/01 0700 In: 720 [P.O.:720] Out: 1250 [Urine:1125; Chest Tube:125] Intake/Output this shift: No intake/output data recorded.  General appearance: alert, cooperative and no distress Heart: regular rate and rhythm, S1, S2 normal, no murmur, click, rub or gallop Lungs: clear to auscultation bilaterally Abdomen: soft, non-tender; bowel sounds normal; no masses,  no organomegaly Extremities: extremities normal, atraumatic, no cyanosis or edema Wound: clean and dry  Lab Results: Recent Labs    02/08/20 0054 02/09/20 0227  WBC 21.7* 24.2*  HGB 9.6* 9.0*  HCT 30.2* 28.0*  PLT 660* 647*   BMET:  Recent Labs    02/08/20 0054 02/09/20 0227  NA 136 137  K 3.6 3.8  CL 101 101  CO2 25 27  GLUCOSE 86 100*  BUN 12 10  CREATININE 0.60* 0.61  CALCIUM 7.7* 7.8*    PT/INR: No results for input(s): LABPROT, INR in the last 72 hours. ABG    Component Value Date/Time   PHART 7.371 02/05/2020 0457   HCO3 20.5 02/05/2020 0457   TCO2 22 02/05/2020 0457   ACIDBASEDEF 5.0 (H) 02/05/2020 0457   O2SAT 95.0 02/05/2020 0457   CBG (last 3)  Recent Labs    02/06/20 0758  GLUCAP 145*    Assessment/Plan: S/P Procedure(s) (LRB): LEFT THORACOTOMY, DRAINAGE OF EMPYEMA, CHEST TUBE INSERTION (Left) CHEST TUBE INSERTION (Left)   1. CV- hemodynamically stable in  NSR 2. Pulm- CT output 125cc today, tubes will need to remain in place until drainage stops, CXR stable. Maybe slight increase in left infiltrate but will await final read 3. ID- afebrile, leukocytosis up to 24.2, on Rocephin, may need to reculture 4. Dispo- patient stable, continue current care per primary, toradol added for pain and this has improved.    LOS: 7 days    Sharlene Dory 02/09/2020   Chart reviewed, patient examined, agree with above. CXR continues to improve. Chest tube output low. I suspect persistent leukocytosis is partly due to inflammation.

## 2020-02-09 NOTE — Progress Notes (Signed)
PROGRESS NOTE    Francisco SpellerLuther Pittman  ZOX:[id number] DOB: 09-20-1960 DOA: 02/02/2020 PCP: Patient, No Pcp Per    Brief Narrative:  Mr. Francisco Pittman is a 59 y.o. M with smoking, EtOH use who presents with non-resolving pain, cough, malaise after recent pneumonia.  Patient admitted to [facility]PS last month for multifocal pneumonia, treated with IV antibiotics, and discharged with oral antibiotics.  After completion of oral antibiotics, he continued to have severe left-sided pain, malaise, chills, and cough, and so he returned to the ER.  On readmission, CT of the chest showed a large loculated pleural effusion, progressed from previous.  Thoracentesis was obtained and about 200 cc of purulent foul-smelling fluid returned, which had polymicrobial Gram stain.  Case was discussed with CT surgery, who appear to have recommended transfer for VATS.    Consultants:   CT surgery  Procedures: Status post VATS 11/26 by Dr. Lavinia SharpsBartel  Antimicrobials:   Levofloxacin   Subjective: No new complaints. Denies chills, sob, abd pain.  Objective: Vitals:   02/09/20 0343 02/09/20 0400 02/09/20 0727 02/09/20 1225  BP: 121/65  (!) 125/58 124/66  Pulse: 78  77   Resp: 19 18 17 20   Temp: 98.6 F (37 C)  98.6 F (37 C) 98.9 F (37.2 C)  TempSrc: Oral  Oral Oral  SpO2: 93% 93% 91%   Weight:      Height:        Intake/Output Summary (Last 24 hours) at 02/09/2020 1226 Last data filed at 02/09/2020 0824 Gross per 24 hour  Intake 240 ml  Output 1025 ml  Net -785 ml   Filed Weights   02/02/20 2231 02/05/20 0500 02/07/20 0528  Weight: 68.4 kg 69.5 kg 73 kg    Examination:  General exam: Appears calm and comfortable  Respiratory system: Clear to auscultation. Respiratory effort normal. Cardiovascular system: S1 & S2 heard, RRR. No JVD, murmurs, rub  CT with creamy pinkish drainage in Gastrointestinal system: Abdomen is nondistended, soft and nontender. Normal bowel sounds heard. Central nervous system:  Alert and oriented. No focal neurological deficits. Extremities: no edema Skin: warm, dry Psychiatry: Judgement and insight appear normal. Mood & affect appropriate.     Data Reviewed: I have personally reviewed following labs and imaging studies  CBC: Recent Labs  Lab 02/02/20 1339 02/04/20 0027 02/05/20 0454 02/05/20 0454 02/05/20 0457 02/06/20 0056 02/07/20 0110 02/08/20 0054 02/09/20 0227  WBC 28.0*   < > 29.8*  --   --  31.1* 20.6* 21.7* 24.2*  NEUTROABS 23.8*  --   --   --   --   --   --   --   --   HGB 10.5*   < > 8.9*   < > 9.2* 8.7* 9.2* 9.6* 9.0*  HCT 32.9*   < > 28.3*   < > 27.0* 27.3* 28.8* 30.2* 28.0*  MCV 91.1   < > 92.2  --   --  89.2 88.3 88.8 88.9  PLT 500*   < > 548*  --   --  560* 637* 660* 647*   < > = values in this interval not displayed.   Basic Metabolic Panel: Recent Labs  Lab 02/05/20 0454 02/05/20 0454 02/05/20 0457 02/06/20 0056 02/07/20 0110 02/08/20 0054 02/09/20 0227  NA 138   < > 142 137 140 136 137  K 3.6   < > 3.4* 3.2* 3.6 3.6 3.8  CL 110  --   --  110 109 101 101  CO2 19*  --   --  18* GLUCOSE 171*  --   --  156* 79 86 100*  BUN 25*  --   --  24* CREATININE 0.68  --   --  0.71 0.57* 0.60* 0.61  CALCIUM 8.1*  --   --  7.8* 7.8* 7.7* 7.8*  MG  --   --   --   --  1.9  --   --    < > = values in this interval not displayed.   GFR: Estimated Creatinine Clearance: 90.7 mL/min (by C-G formula based on SCr of 0.61 mg/dL). Liver Function Tests: Recent Labs  Lab 02/02/20 1339 02/02/20 1903 02/04/20 0027  AST 28  --  35  ALT 17  --  31  ALKPHOS 140*  --  147*  BILITOT 1.0  --  0.9  PROT 7.4 6.2* 5.5*  ALBUMIN 2.6* 2.0* 1.6*   No results for input(s): LIPASE, AMYLASE in the last 168 hours. No results for input(s): AMMONIA in the last 168 hours. Coagulation Profile: Recent Labs  Lab 02/02/20 1903  INR 1.3*   Cardiac Enzymes: No results for input(s): CKTOTAL, CKMB, CKMBINDEX, TROPONINI in the last 168  hours. BNP (last 3 results) No results for input(s): PROBNP in the last 8760 hours. HbA1C: No results for input(s): HGBA1C in the last 72 hours. CBG: Recent Labs  Lab 02/05/20 1601 02/05/20 2034 02/05/20 2350 02/06/20 0408 02/06/20 0758  GLUCAP 174* 133* 142* 131* 145*   Lipid Profile: No results for input(s): CHOL, HDL, LDLCALC, TRIG, CHOLHDL, LDLDIRECT in the last 72 hours. Thyroid Function Tests: No results for input(s): TSH, T4TOTAL, FREET4, T3FREE, THYROIDAB in the last 72 hours. Anemia Panel: No results for input(s): VITAMINB12, FOLATE, FERRITIN, TIBC, IRON, RETICCTPCT in the last 72 hours. Sepsis Labs: Recent Labs  Lab 02/02/20 1541 02/02/20 1903  LATICACIDVEN 1.2 1.0    Recent Results (from the past 240 hour(s))  Resp Panel by RT-PCR (Flu A&B, Covid) Nasopharyngeal Swab     Status: None   Collection Time: 02/02/20  3:21 PM   Specimen: Nasopharyngeal Swab; Nasopharyngeal(NP) swabs in vial transport medium  Result Value Ref Range Status   SARS Coronavirus 2 by RT PCR NEGATIVE NEGATIVE Final    Comment: (NOTE) SARS-CoV-2 target nucleic acids are NOT DETECTED.  The SARS-CoV-2 RNA is generally detectable in upper respiratory specimens during the acute phase of infection. The lowest concentration of SARS-CoV-2 viral copies this assay can detect is 138 copies/mL. A negative result does not preclude SARS-Cov-2 infection and should not be used as the sole basis for treatment or other patient management decisions. A negative result may occur with  improper specimen collection/handling, submission of specimen other than nasopharyngeal swab, presence of viral mutation(s) within the areas targeted by this assay, and inadequate number of viral copies(<138 copies/mL). A negative result must be combined with clinical observations, patient history, and epidemiological information. The expected result is Negative.  Fact Sheet for Patients:   BloggerCourse.com  Fact Sheet for Healthcare Providers:  SeriousBroker.it  This test is no t yet approved or cleared by the [place] FDA and  has been authorized for detection and/or diagnosis of SARS-CoV-2 by FDA under an Emergency Use Authorization (EUA). This EUA will remain  in effect (meaning this test can be used) for the duration of the COVID-19 declaration under Section 564(b)(1) of the Act, 21 U.S.C.section 360bbb-3(b)(1), unless the authorization is terminated  or revoked sooner.       Influenza  A by PCR NEGATIVE NEGATIVE Final   Influenza B by PCR NEGATIVE NEGATIVE Final    Comment: (NOTE) The Xpert Xpress SARS-CoV-2/FLU/RSV plus assay is intended as an aid in the diagnosis of influenza from Nasopharyngeal swab specimens and should not be used as a sole basis for treatment. Nasal washings and aspirates are unacceptable for Xpert Xpress SARS-CoV-2/FLU/RSV testing.  Fact Sheet for Patients: BloggerCourse.com  Fact Sheet for Healthcare Providers: SeriousBroker.it  This test is not yet approved or cleared by the [place] FDA and has been authorized for detection and/or diagnosis of SARS-CoV-2 by FDA under an Emergency Use Authorization (EUA). This EUA will remain in effect (meaning this test can be used) for the duration of the COVID-19 declaration under Section 564(b)(1) of the Act, 21 U.S.C. section 360bbb-3(b)(1), unless the authorization is terminated or revoked.  Performed at [facility], [address] [place], [place] [address]   Blood Culture (routine x 2)     Status: None   Collection Time: 02/02/20  3:41 PM   Specimen: BLOOD  Result Value Ref Range Status   Specimen Description BLOOD  Final   Special Requests NONE  Final   Culture   Final    NO GROWTH 5 DAYS Performed at [facility], [address] [place], [place] [address]    Report  Status 02/07/2020 FINAL  Final  Blood Culture (routine x 2)     Status: None   Collection Time: 02/02/20  3:41 PM   Specimen: BLOOD  Result Value Ref Range Status   Specimen Description BLOOD LEFT ANTECUBITAL  Final   Special Requests   Final    BOTTLES DRAWN AEROBIC AND ANAEROBIC Blood Culture adequate volume   Culture   Final    NO GROWTH 5 DAYS Performed at [facility], [address], [place], [place] [address]    Report Status 02/07/2020 FINAL  Final  Urine culture     Status: None   Collection Time: 02/02/20  4:40 PM   Specimen: Urine, Clean Catch  Result Value Ref Range Status   Specimen Description   Final    URINE, CLEAN CATCH Performed at [facility], [address], [place], [place] [address]    Special Requests   Final    NONE Performed at [facility], [address], [place], [place] [address]    Culture   Final    NO GROWTH Performed at [facility], [address], [place], [place] [address]    Report Status 02/04/2020 FINAL  Final  Fungus Culture With Stain     Status: None (Preliminary result)   Collection Time: 02/02/20  5:40 PM   Specimen: Lung; Pleural Fluid  Result Value Ref Range Status   Fungus Stain Final report  Final    Comment: (NOTE) Performed At: [facility] [address] [place], [place] [id number] Jolene Schimke MD BM:[id number]    Fungus (Mycology) Culture PENDING  Incomplete   Fungal Source PLEURAL  Final    Comment: Performed at [facility], [address] [place], [place] [address]  Culture, body fluid-bottle     Status: Abnormal   Collection Time: 02/02/20  5:40 PM   Specimen: Pleura  Result Value Ref Range Status   Specimen Description   Final    PLEURAL Performed at [facility], [address], [place], [place] [address]    Special Requests   Final    BOTTLES DRAWN AEROBIC AND ANAEROBIC 10CC Performed at [facility], [address], [place], [place] [address]    Gram Stain   Final    GRAM POSITIVE COCCI IN  BOTH AEROBIC AND ANAEROBIC BOTTLES Gram Stain Report Called to,Read Back By and Verified With: T Arlis Porta @2140  02/02/20 MKELLY Performed at [facility], [address], [place], [place]    Culture STREPTOCOCCUS CONSTELLATUS (A)  Final   Report Status 02/05/2020 FINAL  Final   Organism ID, Bacteria STREPTOCOCCUS CONSTELLATUS  Final      Susceptibility   Streptococcus constellatus - MIC*    PENICILLIN INTERMEDIATE Intermediate     CEFTRIAXONE 1 SENSITIVE Sensitive     ERYTHROMYCIN <=0.12 SENSITIVE Sensitive     LEVOFLOXACIN <=0.25 SENSITIVE Sensitive     VANCOMYCIN 0.25 SENSITIVE Sensitive     * STREPTOCOCCUS CONSTELLATUS  Gram stain     Status: None   Collection Time: 02/02/20  5:40 PM   Specimen: Pleura  Result Value Ref Range Status   Specimen Description PLEURAL  Final   Special Requests NONE  Final   Gram Stain   Final    ABUNDANT GRAM POSITIVE COCCI RARE GRAM POSITIVE RODS WBC PRESENT, PREDOMINANTLY MONONUCLEAR Gram Stain Report Called to,Read Back By and Verified With: TONI WALKER@1936  02/02/20 BY JONES,T Performed at [facility], [address] [place] [address], [place], [place]    Report Status 02/02/2020 FINAL  Final  Fungus Culture Result     Status: None   Collection Time: 02/02/20  5:40 PM  Result Value Ref Range Status   Result 1 Comment  Final    Comment: (NOTE) KOH/Calcofluor preparation:  no fungus observed. Performed At: [facility] [address] [place], [place] [id number] MD Jolene Schimke   Surgical pcr screen     Status: None   Collection Time: 02/02/20 10:39 PM   Specimen: Nasal Mucosa; Nasal Swab  Result Value Ref Range Status   MRSA, PCR NEGATIVE NEGATIVE Final   Staphylococcus aureus NEGATIVE NEGATIVE Final    Comment: (NOTE) The Xpert SA Assay (FDA approved for NASAL specimens in patients 20 years of age and older), is one component of a comprehensive surveillance program. It is not intended to diagnose infection  nor to guide or monitor treatment. Performed at [facility], [address], [place], [place]   Aerobic/Anaerobic Culture (surgical/deep wound)     Status: None (Preliminary result)   Collection Time: 02/04/20  4:32 PM   Specimen: PATH Other; Body Fluid  Result Value Ref Range Status   Specimen Description FLUID PLEURAL LEFT  Final   Special Requests NONE  Final   Gram Stain   Final    MODERATE WBC PRESENT,BOTH PMN AND MONONUCLEAR ABUNDANT GRAM POSITIVE COCCI MODERATE GRAM NEGATIVE RODS Performed at [facility], [address] [place], [place]    Culture   Final    RARE VIRIDANS STREPTOCOCCUS NO ANAEROBES ISOLATED; CULTURE IN PROGRESS FOR 5 DAYS    Report Status PENDING  Incomplete   Organism ID, Bacteria VIRIDANS STREPTOCOCCUS  Final      Susceptibility   Viridans streptococcus - MIC*    PENICILLIN INTERMEDIATE Intermediate     CEFTRIAXONE 2 INTERMEDIATE Intermediate     ERYTHROMYCIN <=0.12 SENSITIVE Sensitive     LEVOFLOXACIN 0.5 SENSITIVE Sensitive     VANCOMYCIN 0.25 SENSITIVE Sensitive     * RARE VIRIDANS STREPTOCOCCUS  Acid Fast Smear (AFB)     Status: None   Collection Time: 02/04/20  4:32 PM   Specimen: PATH  Other; Body Fluid  Result Value Ref Range Status   AFB Specimen Processing Concentration  Final   Acid Fast Smear Negative  Final    Comment: (NOTE) Performed At: [facility] [address] [place], [place] [id number] Jolene Schimke MD AS:[id number]    Source (AFB) FLUID  Final    Comment: LEFT PLEURAL Performed at [facility], [address], [place], [place] [address]   Aerobic/Anaerobic Culture (surgical/deep wound)     Status: None (Preliminary result)   Collection Time: 02/04/20  5:21 PM   Specimen: PATH Soft tissue resection  Result Value Ref Range Status   Specimen Description TISSUE PLEURAL  Final   Special Requests PLEURAL PEEL PT ON VANC  Final   Gram Stain   Final    FEW WBC PRESENT,BOTH  PMN AND MONONUCLEAR MODERATE GRAM POSITIVE COCCI FEW GRAM NEGATIVE RODS Performed at [facility], [address] [place], [place] [address]    Culture   Final    RARE VIRIDANS STREPTOCOCCUS SUSCEPTIBILITIES PERFORMED ON PREVIOUS CULTURE WITHIN THE LAST 5 DAYS. NO ANAEROBES ISOLATED; CULTURE IN PROGRESS FOR 5 DAYS    Report Status PENDING  Incomplete         Radiology Studies: DG CHEST PORT 1 VIEW  Result Date: 02/09/2020 CLINICAL DATA:  Chest tube, post lobectomy, empyema EXAM: PORTABLE CHEST 1 VIEW COMPARISON:  Portable exam 0658 hours compared to 02/07/2020 FINDINGS: Pair of LEFT thoracostomy tubes again identified. Normal heart size, mediastinal contours, and pulmonary vascularity. Linear scarring RIGHT upper lobe. Persistent pleuroparenchymal opacities at LEFT lung base likely representing combination of pleural effusion/thickening and lung consolidation. RIGHT lung clear. No pneumothorax. IMPRESSION: Persistent pleuroparenchymal opacities at LEFT lung base likely representing a combination of pleural effusion/thickening in patient with known empyema as well as coexisting consolidation in the lower LEFT lung. Electronically Signed   By: Ulyses Southward M.D.   On: 02/09/2020 08:28        Scheduled Meds: . acetaminophen  1,000 mg Oral Q6H   Or  . acetaminophen (TYLENOL) oral liquid 160 mg/5 mL  1,000 mg Oral Q6H  . Chlorhexidine Gluconate Cloth  6 each Topical Daily  . folic acid  1 mg Oral Daily  . guaiFENesin  600 mg Oral BID  . mouth rinse  15 mL Mouth Rinse BID  . morphine   Intravenous Q4H  . multivitamin with minerals  1 tablet Oral Daily  . nicotine  14 mg Transdermal Daily  . polyethylene glycol  17 g Oral Daily  . senna-docusate  1 tablet Oral QHS   Continuous Infusions: . levofloxacin (LEVAQUIN) IV 750 mg (02/08/20 1513)    Assessment & Plan:   Principal Problem:   Sepsis (HCC) Active Problems:   Multifocal pneumonia with Lt Sided Pleural Effusion    Normocytic anemia/iron deficiency noted   Protein calorie malnutrition/HypoAlbulminemia   COPD and Tobacco abuse   Alcohol Abuse- Quit 11/2019   Poor dentition-Carries   COPD (chronic obstructive pulmonary disease) ---Smoker   Lt Empyema lung -loculated infected left pleural fluid consistent with empyema   S/P thoracotomy   Pressure injury of skin   Empyema S/p VATS 11/26 by Dr. Lavinia Sharps Leukocytosis up.continue to monitor Afebrile Continue levaquin CT mx per CTS.    Smoking Counseled on smoking cessation Continue nicotine patch   Suspected COPD -no acute flare up Will need to f/u with pcp for PFT evaluation  Continue albuterol prn   History of alcohol use Continue thiamine and  folic acid   Anemia of chronic disease Hemoglobin stable continue to monitor  Hypokalemia  remain stable  DVT prophylaxis: SCD Code Status: Full Family Communication: None at bedside  Status is: Inpatient  Remains inpatient appropriate because:Inpatient level of care appropriate due to severity of illness   Dispo: The patient is from: Home              Anticipated d/c is to: TBD              Anticipated d/c date is: > 3 days              Patient currently is not medically stable to d/c. Chest tube needs to be removed, PT evalve pending.Disposition per CTS.            LOS: 7 days   Time spent: 35 min with >50% on coc    Lynn Ito, MD [facility] Pager [phone]  If 7PM-7AM, please contact night-coverage [email or web address] Password [facility] 02/09/2020, 12:26 PM

## 2020-02-10 ENCOUNTER — Inpatient Hospital Stay (HOSPITAL_COMMUNITY): Payer: Medicaid - Out of State

## 2020-02-10 DIAGNOSIS — F1010 Alcohol abuse, uncomplicated: Secondary | ICD-10-CM | POA: Diagnosis not present

## 2020-02-10 DIAGNOSIS — J189 Pneumonia, unspecified organism: Secondary | ICD-10-CM | POA: Diagnosis not present

## 2020-02-10 DIAGNOSIS — A419 Sepsis, unspecified organism: Secondary | ICD-10-CM | POA: Diagnosis not present

## 2020-02-10 DIAGNOSIS — J449 Chronic obstructive pulmonary disease, unspecified: Secondary | ICD-10-CM | POA: Diagnosis not present

## 2020-02-10 LAB — AEROBIC/ANAEROBIC CULTURE W GRAM STAIN (SURGICAL/DEEP WOUND)

## 2020-02-10 MED ORDER — ENSURE ENLIVE PO LIQD
237.0000 mL | Freq: Two times a day (BID) | ORAL | Status: DC
  Administered 2020-02-10 – 2020-02-15 (×12): 237 mL via ORAL

## 2020-02-10 NOTE — Progress Notes (Signed)
Ambulated along the hallway with front wheel walker on room air , tolerated well. Dressing to chest tube site  Changed, site is red and suture intact. Dressing to incision changed, staples intact site clean and dry.

## 2020-02-10 NOTE — Progress Notes (Addendum)
      [address]       [place] [address]             [phone]      6 Days Post-Op Procedure(s) (LRB): LEFT THORACOTOMY, DRAINAGE OF EMPYEMA, CHEST TUBE INSERTION (Left) CHEST TUBE INSERTION (Left) Subjective: Feels okay this morning. Pain is better, eating breakfast  Objective: Vital signs in last 24 hours: Temp:  [98.6 F (37 C)-99.5 F (37.5 C)] 98.6 F (37 C) (12/02 0731) Pulse Rate:  [84-89] 88 (12/02 0731) Cardiac Rhythm: Normal sinus rhythm (12/02 0710) Resp:  [18-20] 19 (12/02 0731) BP: (115-129)/(59-70) 115/70 (12/02 0731) SpO2:  [92 %-94 %] 93 % (12/02 0731)     Intake/Output from previous day: 12/01 0701 - 12/02 0700 In: -  Out: 1214 [Urine:1100; Chest Tube:114] Intake/Output this shift: Total I/O In: -  Out: 400 [Urine:400]  General appearance: alert, cooperative and no distress Heart: regular rate and rhythm, S1, S2 normal, no murmur, click, rub or gallop Lungs: clear to auscultation bilaterally Abdomen: soft, non-tender; bowel sounds normal; no masses,  no organomegaly Extremities: extremities normal, atraumatic, no cyanosis or edema Wound: clean and dry  Lab Results: Recent Labs    02/08/20 0054 02/09/20 0227  WBC 21.7* 24.2*  HGB 9.6* 9.0*  HCT 30.2* 28.0*  PLT 660* 647*   BMET:  Recent Labs    02/08/20 0054 02/09/20 0227  NA 136 137  K 3.6 3.8  CL 101 101  CO2 25 27  GLUCOSE 86 100*  BUN 12 10  CREATININE 0.60* 0.61  CALCIUM 7.7* 7.8*    PT/INR: No results for input(s): LABPROT, INR in the last 72 hours. ABG    Component Value Date/Time   PHART 7.371 02/05/2020 0457   HCO3 20.5 02/05/2020 0457   TCO2 22 02/05/2020 0457   ACIDBASEDEF 5.0 (H) 02/05/2020 0457   O2SAT 95.0 02/05/2020 0457   CBG (last 3)  No results for input(s): GLUCAP in the last 72 hours.  Assessment/Plan: S/P Procedure(s) (LRB): LEFT THORACOTOMY, DRAINAGE OF EMPYEMA, CHEST TUBE INSERTION (Left) CHEST TUBE INSERTION (Left)  1.  CV- hemodynamically stable in NSR 2. Pulm- CT output114cc today, tubes will need to remain in place until drainage stops, CXRstable. No CXR this morning so will order one 3. ID- afebrile, leukocytosis up to 24.2, on Rocephin 4. Dispo- patient stable, continue current care per primary, toradol added for pain and this has improved, will add a BMP to monitor creatinine-last creatinine was 0.61    LOS: 8 days    Sharlene Dory 02/10/2020   Chart reviewed, patient examined, agree with above. He feels ok. Only complaint is of left chest wall pain.  CXR looks stable with left mid lung density extending to chest wall. Chest tubes did not have much recorded today but they tubes were full of serous fluid with debris when I saw him. I marked the [place]. Will keep tubes in for now and reevaluate tomorrow.

## 2020-02-10 NOTE — Progress Notes (Signed)
PROGRESS NOTE    Francisco Pittman  UJW:[id number] DOB: 1960-10-31 DOA: 02/02/2020 PCP: Patient, No Pcp Per    Brief Narrative:  Mr. Francisco Pittman is a 59 y.o. M with smoking, EtOH use who presents with non-resolving pain, cough, malaise after recent pneumonia.  Patient admitted to APS last month for multifocal pneumonia, treated with IV antibiotics, and discharged with oral antibiotics.  After completion of oral antibiotics, he continued to have severe left-sided pain, malaise, chills, and cough, and so he returned to the ER.  On readmission, CT of the chest showed a large loculated pleural effusion, progressed from previous.  Thoracentesis was obtained and about 200 cc of purulent foul-smelling fluid returned, which had polymicrobial Gram stain.  Case was discussed with CT surgery, who appear to have recommended transfer for VATS.  12/2- Still draining .   Consultants:   CT surgery  Procedures: Status post VATS 11/26 by Dr. Lavinia Sharps  Antimicrobials:   Levofloxacin   Subjective: Appears weak. Wife states, pt with decrease po intake, asking for ensure. No worsening sob, cp, abd pain  Objective: Vitals:   02/10/20 0350 02/10/20 0400 02/10/20 0731 02/10/20 0800  BP: 128/65  115/70   Pulse: 86  88   Resp: Temp: 98.9 F (37.2 C)  98.6 F (37 C)   TempSrc: Oral  Oral   SpO2: 94% 94% 93% 93%  Weight:      Height:        Intake/Output Summary (Last 24 hours) at 02/10/2020 0814 Last data filed at 02/10/2020 0731 Gross per 24 hour  Intake --  Output 1614 ml  Net -1614 ml   Filed Weights   02/02/20 2231 02/05/20 0500 02/07/20 0528  Weight: 68.4 kg 69.5 kg 73 kg    Examination: Alert oriented x3, appears tired but comfortable CTA no wheeze or Rales, decreased breath sounds at bases Regular S1-S2 no murmurs Soft nontender positive bowel sounds Mild edema b/l Mood and affect appropriate in current setting    Data Reviewed: I have personally reviewed  following labs and imaging studies  CBC: Recent Labs  Lab 02/05/20 0454 02/05/20 0454 02/05/20 0457 02/06/20 0056 02/07/20 0110 02/08/20 0054 02/09/20 0227  WBC 29.8*  --   --  31.1* 20.6* 21.7* 24.2*  HGB 8.9*   < > 9.2* 8.7* 9.2* 9.6* 9.0*  HCT 28.3*   < > 27.0* 27.3* 28.8* 30.2* 28.0*  MCV 92.2  --   --  89.2 88.3 88.8 88.9  PLT 548*  --   --  560* 637* 660* 647*   < > = values in this interval not displayed.   Basic Metabolic Panel: Recent Labs  Lab 02/05/20 0454 02/05/20 0454 02/05/20 0457 02/06/20 0056 02/07/20 0110 02/08/20 0054 02/09/20 0227  NA 138   < > 142 137 140 136 137  K 3.6   < > 3.4* 3.2* 3.6 3.6 3.8  CL 110  --   --  110 109 101 101  CO2 19*  --   --  18* GLUCOSE 171*  --   --  156* 79 86 100*  BUN 25*  --   --  24* CREATININE 0.68  --   --  0.71 0.57* 0.60* 0.61  CALCIUM 8.1*  --   --  7.8* 7.8* 7.7* 7.8*  MG  --   --   --   --  1.9  --   --    < > =  values in this interval not displayed.   GFR: Estimated Creatinine Clearance: 90.7 mL/min (by C-G formula based on SCr of 0.61 mg/dL). Liver Function Tests: Recent Labs  Lab 02/04/20 0027  AST 35  ALT 31  ALKPHOS 147*  BILITOT 0.9  PROT 5.5*  ALBUMIN 1.6*   No results for input(s): LIPASE, AMYLASE in the last 168 hours. No results for input(s): AMMONIA in the last 168 hours. Coagulation Profile: No results for input(s): INR, PROTIME in the last 168 hours. Cardiac Enzymes: No results for input(s): CKTOTAL, CKMB, CKMBINDEX, TROPONINI in the last 168 hours. BNP (last 3 results) No results for input(s): PROBNP in the last 8760 hours. HbA1C: No results for input(s): HGBA1C in the last 72 hours. CBG: Recent Labs  Lab 02/05/20 1601 02/05/20 2034 02/05/20 2350 02/06/20 0408 02/06/20 0758  GLUCAP 174* 133* 142* 131* 145*   Lipid Profile: No results for input(s): CHOL, HDL, LDLCALC, TRIG, CHOLHDL, LDLDIRECT in the last 72 hours. Thyroid Function Tests: No results for  input(s): TSH, T4TOTAL, FREET4, T3FREE, THYROIDAB in the last 72 hours. Anemia Panel: No results for input(s): VITAMINB12, FOLATE, FERRITIN, TIBC, IRON, RETICCTPCT in the last 72 hours. Sepsis Labs: No results for input(s): PROCALCITON, LATICACIDVEN in the last 168 hours.  Recent Results (from the past 240 hour(s))  Resp Panel by RT-PCR (Flu A&B, Covid) Nasopharyngeal Swab     Status: None   Collection Time: 02/02/20  3:21 PM   Specimen: Nasopharyngeal Swab; Nasopharyngeal(NP) swabs in vial transport medium  Result Value Ref Range Status   SARS Coronavirus 2 by RT PCR NEGATIVE NEGATIVE Final    Comment: (NOTE) SARS-CoV-2 target nucleic acids are NOT DETECTED.  The SARS-CoV-2 RNA is generally detectable in upper respiratory specimens during the acute phase of infection. The lowest concentration of SARS-CoV-2 viral copies this assay can detect is 138 copies/mL. A negative result does not preclude SARS-Cov-2 infection and should not be used as the sole basis for treatment or other patient management decisions. A negative result may occur with  improper specimen collection/handling, submission of specimen other than nasopharyngeal swab, presence of viral mutation(s) within the areas targeted by this assay, and inadequate number of viral copies(<138 copies/mL). A negative result must be combined with clinical observations, patient history, and epidemiological information. The expected result is Negative.  Fact Sheet for Patients:  BloggerCourse.com  Fact Sheet for Healthcare Providers:  SeriousBroker.it  This test is no t yet approved or cleared by the [place] FDA and  has been authorized for detection and/or diagnosis of SARS-CoV-2 by FDA under an Emergency Use Authorization (EUA). This EUA will remain  in effect (meaning this test can be used) for the duration of the COVID-19 declaration under Section 564(b)(1) of the Act,  21 U.S.C.section 360bbb-3(b)(1), unless the authorization is terminated  or revoked sooner.       Influenza A by PCR NEGATIVE NEGATIVE Final   Influenza B by PCR NEGATIVE NEGATIVE Final    Comment: (NOTE) The Xpert Xpress SARS-CoV-2/FLU/RSV plus assay is intended as an aid in the diagnosis of influenza from Nasopharyngeal swab specimens and should not be used as a sole basis for treatment. Nasal washings and aspirates are unacceptable for Xpert Xpress SARS-CoV-2/FLU/RSV testing.  Fact Sheet for Patients: BloggerCourse.com  Fact Sheet for Healthcare Providers: SeriousBroker.it  This test is not yet approved or cleared by the [place] FDA and has been authorized for detection and/or diagnosis of SARS-CoV-2 by FDA under an Emergency Use Authorization (EUA). This EUA  will remain in effect (meaning this test can be used) for the duration of the COVID-19 declaration under Section 564(b)(1) of the Act, 21 U.S.C. section 360bbb-3(b)(1), unless the authorization is terminated or revoked.  Performed at [facility], [address] [place], [place] [address]   Blood Culture (routine x 2)     Status: None   Collection Time: 02/02/20  3:41 PM   Specimen: BLOOD  Result Value Ref Range Status   Specimen Description BLOOD  Final   Special Requests NONE  Final   Culture   Final    NO GROWTH 5 DAYS Performed at [facility], [address] [place], [place] [address]    Report Status 02/07/2020 FINAL  Final  Blood Culture (routine x 2)     Status: None   Collection Time: 02/02/20  3:41 PM   Specimen: BLOOD  Result Value Ref Range Status   Specimen Description BLOOD LEFT ANTECUBITAL  Final   Special Requests   Final    BOTTLES DRAWN AEROBIC AND ANAEROBIC Blood Culture adequate volume   Culture   Final    NO GROWTH 5 DAYS Performed at [facility], [address] [place], [place] [address]    Report Status 02/07/2020 FINAL  Final   Urine culture     Status: None   Collection Time: 02/02/20  4:40 PM   Specimen: Urine, Clean Catch  Result Value Ref Range Status   Specimen Description   Final    URINE, CLEAN CATCH Performed at [facility], [address] [place], [place] [address]    Special Requests   Final    NONE Performed at [facility], [address] [place], [place] [address]    Culture   Final    NO GROWTH Performed at [facility], [address] [place], [place] [address]    Report Status 02/04/2020 FINAL  Final  Fungus Culture With Stain     Status: None (Preliminary result)   Collection Time: 02/02/20  5:40 PM   Specimen: Lung; Pleural Fluid  Result Value Ref Range Status   Fungus Stain Final report  Final    Comment: (NOTE) Performed At: [facility] [address] [place], [place] [id number] Jolene SchimkeNagendra Sanjai MD BM:[id number]:[phone]    Fungus (Mycology) Culture PENDING  Incomplete   Fungal Source PLEURAL  Final    Comment: Performed at [facility], [address] [place], [place] [address]  Culture, body fluid-bottle     Status: Abnormal   Collection Time: 02/02/20  5:40 PM   Specimen: Pleura  Result Value Ref Range Status   Specimen Description   Final    PLEURAL Performed at [facility], [address] [place], [place] [address]    Special Requests   Final    BOTTLES DRAWN AEROBIC AND ANAEROBIC 10CC Performed at [facility], [address] [place], [place] [address]    Gram Stain   Final    GRAM POSITIVE COCCI IN BOTH AEROBIC AND ANAEROBIC BOTTLES Gram Stain Report Called to,Read Back By and Verified With: Kennon Portela WALKER,RN @2140  02/02/20 [facility] Performed at [facility], [address] [place], [place] [address]    Culture STREPTOCOCCUS CONSTELLATUS (A)  Final   Report Status 02/05/2020 FINAL  Final   Organism ID, Bacteria STREPTOCOCCUS CONSTELLATUS  Final      Susceptibility   Streptococcus constellatus - MIC*    PENICILLIN INTERMEDIATE Intermediate     CEFTRIAXONE 1  SENSITIVE Sensitive     ERYTHROMYCIN <=  0.12 SENSITIVE Sensitive     LEVOFLOXACIN <=0.25 SENSITIVE Sensitive     VANCOMYCIN 0.25 SENSITIVE Sensitive     * STREPTOCOCCUS CONSTELLATUS  Gram stain     Status: None   Collection Time: 02/02/20  5:40 PM   Specimen: Pleura  Result Value Ref Range Status   Specimen Description PLEURAL  Final   Special Requests NONE  Final   Gram Stain   Final    ABUNDANT GRAM POSITIVE COCCI RARE GRAM POSITIVE RODS WBC PRESENT, PREDOMINANTLY MONONUCLEAR Gram Stain Report Called to,Read Back By and Verified With: TONI WALKER@1936  02/02/20 BY JONES,T Performed at [facility], [address] [place], [place] [address]    Report Status 02/02/2020 FINAL  Final  Fungus Culture Result     Status: None   Collection Time: 02/02/20  5:40 PM  Result Value Ref Range Status   Result 1 Comment  Final    Comment: (NOTE) KOH/Calcofluor preparation:  no fungus observed. Performed At: [facility] [address] [place], [place] [id number] Jolene Schimke MD XB:[id number]   Surgical pcr screen     Status: None   Collection Time: 02/02/20 10:39 PM   Specimen: Nasal Mucosa; Nasal Swab  Result Value Ref Range Status   MRSA, PCR NEGATIVE NEGATIVE Final   Staphylococcus aureus NEGATIVE NEGATIVE Final    Comment: (NOTE) The Xpert SA Assay (FDA approved for NASAL specimens in patients 50 years of age and older), is one component of a comprehensive surveillance program. It is not intended to diagnose infection nor to guide or monitor treatment. Performed at [facility], [address] [place], [place] [address]   Aerobic/Anaerobic Culture (surgical/deep wound)     Status: None (Preliminary result)   Collection Time: 02/04/20  4:32 PM   Specimen: PATH Other; Body Fluid  Result Value Ref Range Status   Specimen Description FLUID PLEURAL LEFT  Final   Special Requests NONE  Final   Gram Stain   Final    MODERATE WBC PRESENT,BOTH PMN AND  MONONUCLEAR ABUNDANT GRAM POSITIVE COCCI MODERATE GRAM NEGATIVE RODS    Culture   Final    RARE VIRIDANS STREPTOCOCCUS HOLDING FOR POSSIBLE ANAEROBE Performed at [facility], [address] [place], [place] [address]    Report Status PENDING  Incomplete   Organism ID, Bacteria VIRIDANS STREPTOCOCCUS  Final      Susceptibility   Viridans streptococcus - MIC*    PENICILLIN INTERMEDIATE Intermediate     CEFTRIAXONE 2 INTERMEDIATE Intermediate     ERYTHROMYCIN <=0.12 SENSITIVE Sensitive     LEVOFLOXACIN 0.5 SENSITIVE Sensitive     VANCOMYCIN 0.25 SENSITIVE Sensitive     * RARE VIRIDANS STREPTOCOCCUS  Acid Fast Smear (AFB)     Status: None   Collection Time: 02/04/20  4:32 PM   Specimen: PATH Other; Body Fluid  Result Value Ref Range Status   AFB Specimen Processing Concentration  Final   Acid Fast Smear Negative  Final    Comment: (NOTE) Performed At: [facility] [address] [place], [place] [id number] Jolene Schimke MD KG:[id number]    Source (AFB) FLUID  Final    Comment: LEFT PLEURAL Performed at [facility], [address], [place], [place] [address]   Aerobic/Anaerobic Culture (surgical/deep wound)     Status: None (Preliminary result)   Collection Time: 02/04/20  5:21 PM   Specimen: PATH Soft tissue resection  Result Value Ref Range Status   Specimen Description TISSUE PLEURAL  Final   Special Requests PLEURAL PEEL PT ON VANC  Final   Gram Stain   Final    FEW WBC PRESENT,BOTH PMN AND MONONUCLEAR MODERATE GRAM POSITIVE COCCI FEW GRAM NEGATIVE RODS    Culture   Final    RARE VIRIDANS STREPTOCOCCUS SUSCEPTIBILITIES PERFORMED ON PREVIOUS CULTURE WITHIN THE LAST 5 DAYS. HOLDING FOR POSSIBLE ANAEROBE Performed at [facility], [address], [place], [place] [address]    Report Status PENDING  Incomplete         Radiology Studies: DG CHEST PORT 1 VIEW  Result Date: 02/09/2020 CLINICAL DATA:  Chest tube, post lobectomy,  empyema EXAM: PORTABLE CHEST 1 VIEW COMPARISON:  Portable exam 0658 hours compared to 02/07/2020 FINDINGS: Pair of LEFT thoracostomy tubes again identified. Normal heart size, mediastinal contours, and pulmonary vascularity. Linear scarring RIGHT upper lobe. Persistent pleuroparenchymal opacities at LEFT lung base likely representing combination of pleural effusion/thickening and lung consolidation. RIGHT lung clear. No pneumothorax. IMPRESSION: Persistent pleuroparenchymal opacities at LEFT lung base likely representing a combination of pleural effusion/thickening in patient with known empyema as well as coexisting consolidation in the lower LEFT lung. Electronically Signed   By: Ulyses Southward M.D.   On: 02/09/2020 08:28        Scheduled Meds: . Chlorhexidine Gluconate Cloth  6 each Topical Daily  . folic acid  1 mg Oral Daily  . guaiFENesin  600 mg Oral BID  . mouth rinse  15 mL Mouth Rinse BID  . morphine   Intravenous Q4H  . multivitamin with minerals  1 tablet Oral Daily  . nicotine  14 mg Transdermal Daily  . polyethylene glycol  17 g Oral Daily  . senna-docusate  1 tablet Oral QHS  . thiamine  100 mg Oral Daily   Continuous Infusions: . levofloxacin (LEVAQUIN) IV 750 mg (02/09/20 1618)    Assessment & Plan:   Principal Problem:   Sepsis (HCC) Active Problems:   Multifocal pneumonia with Lt Sided Pleural Effusion   Normocytic anemia/iron deficiency noted   Protein calorie malnutrition/HypoAlbulminemia   COPD and Tobacco abuse   Alcohol Abuse- Quit 11/2019   Poor dentition-Carries   COPD (chronic obstructive pulmonary disease) ---Smoker   Lt Empyema lung -loculated infected left pleural fluid consistent with empyema   S/P thoracotomy   Pressure injury of skin   Empyema S/p VATS 11/26 by Dr. Lavinia Sharps WBC was continue to monitor Afebrile Continue with Levaquin CTS managing CT-tube to remain in place until drainage stops.  Check chest  cxr   Smoking Was counseled on  smoking cessation  Continue nicotine patch     Suspected COPD No acute flareup  Will need to follow-up with PCP for PFT evaluation and diagnosis  Continue albuterol as needed    History of alcohol use Continue thiamine and folic acid   Anemia of chronic disease Hemoglobin stable continue to monitor  Hypokalemia  remain stable  DVT prophylaxis: SCD Code Status: Full Family Communication: None at bedside  Status is: Inpatient  Remains inpatient appropriate because:Inpatient level of care appropriate due to severity of illness   Dispo: The patient is from: Home              Anticipated d/c is to: TBD              Anticipated d/c date is: > 3 days              Patient currently is not medically stable to d/c. Chest  tube needs to be removed, PT evalve pending.Disposition per CTS.            LOS: 8 days   Time spent: 35 min with >50% on coc    Lynn Ito, MD [facility] Pager [phone]  If 7PM-7AM, please contact night-coverage [email or web address] Password [email or web address] 02/10/2020, 8:14 AM

## 2020-02-11 DIAGNOSIS — F1010 Alcohol abuse, uncomplicated: Secondary | ICD-10-CM | POA: Diagnosis not present

## 2020-02-11 DIAGNOSIS — J869 Pyothorax without fistula: Secondary | ICD-10-CM | POA: Diagnosis not present

## 2020-02-11 DIAGNOSIS — A419 Sepsis, unspecified organism: Secondary | ICD-10-CM | POA: Diagnosis not present

## 2020-02-11 DIAGNOSIS — J449 Chronic obstructive pulmonary disease, unspecified: Secondary | ICD-10-CM | POA: Diagnosis not present

## 2020-02-11 LAB — CBC
HCT: 27.4 % — ABNORMAL LOW (ref 39.0–52.0)
Hemoglobin: 9 g/dL — ABNORMAL LOW (ref 13.0–17.0)
MCH: 29.1 pg (ref 26.0–34.0)
MCHC: 32.8 g/dL (ref 30.0–36.0)
MCV: 88.7 fL (ref 80.0–100.0)
Platelets: 679 K/uL — ABNORMAL HIGH (ref 150–400)
RBC: 3.09 MIL/uL — ABNORMAL LOW (ref 4.22–5.81)
RDW: 14.6 % (ref 11.5–15.5)
WBC: 22.8 K/uL — ABNORMAL HIGH (ref 4.0–10.5)
nRBC: 0 % (ref 0.0–0.2)

## 2020-02-11 LAB — BASIC METABOLIC PANEL WITH GFR
Anion gap: 8 (ref 5–15)
BUN: 9 mg/dL (ref 8–23)
CO2: 27 mmol/L (ref 22–32)
Calcium: 8 mg/dL — ABNORMAL LOW (ref 8.9–10.3)
Chloride: 98 mmol/L (ref 98–111)
Creatinine, Ser: 0.71 mg/dL (ref 0.61–1.24)
GFR, Estimated: >60 mL/min
Glucose, Bld: 95 mg/dL (ref 70–99)
Potassium: 4.5 mmol/L (ref 3.5–5.1)
Sodium: 133 mmol/L — ABNORMAL LOW (ref 135–145)

## 2020-02-11 MED ORDER — METRONIDAZOLE 500 MG PO TABS
500.0000 mg | ORAL_TABLET | Freq: Three times a day (TID) | ORAL | Status: DC
  Administered 2020-02-11 – 2020-02-16 (×16): 500 mg via ORAL
  Filled 2020-02-11 (×16): qty 1

## 2020-02-11 NOTE — TOC Progression Note (Signed)
Transition of Care [facility]) - Progression Note    Patient Details  Name: Francisco Pittman MRN: [id number] Date of Birth: 03-20-1960  [facility]) CM/SW Contact  Leone Haven, RN Phone Number: 02/11/2020, 6:08 PM  Clinical Narrative:    Patient with empyema, chest tube to water seal, conts on pca pump.  TOC team will cont to follow.        Expected Discharge Plan and Services                                                 Social Determinants of Health (SDOH) Interventions    Readmission Risk Interventions No flowsheet data found.

## 2020-02-11 NOTE — Progress Notes (Signed)
7 Days Post-Op Procedure(s) (LRB): LEFT THORACOTOMY, DRAINAGE OF EMPYEMA, CHEST TUBE INSERTION (Left) CHEST TUBE INSERTION (Left) Subjective:  Sore but overall feels ok  Objective: Vital signs in last 24 hours: Temp:  [98.6 F (37 C)-99.5 F (37.5 C)] 99.1 F (37.3 C) (12/03 0349) Pulse Rate:  [88-93] 91 (12/03 0349) Cardiac Rhythm: Normal sinus rhythm (12/03 0716) Resp:  [17-20] 20 (12/03 0349) BP: (111-128)/(58-69) 111/58 (12/03 0349) SpO2:  [20 %-93 %] 92 % (12/03 0349) FiO2 (%):  [90 %-92 %] 90 % (12/02 1652)  Hemodynamic parameters for last 24 hours:    Intake/Output from previous day: 12/02 0701 - 12/03 0700 In: 794 [P.O.:694; IV Piggyback:100] Out: 1810 [Urine:1700; Chest Tube:110] Intake/Output this shift: No intake/output data recorded.  General appearance: alert and cooperative Heart: regular rate and rhythm Lungs: clear to auscultation bilaterally Wound: incision ok Chest tube output serous.  Lab Results: Recent Labs    02/09/20 0227 02/11/20 0101  WBC 24.2* 22.8*  HGB 9.0* 9.0*  HCT 28.0* 27.4*  PLT 647* 679*   BMET:  Recent Labs    02/09/20 0227 02/11/20 0101  NA 137 133*  K 3.8 4.5  CL 101 98  CO2 27 27  GLUCOSE 100* 95  BUN 10 9  CREATININE 0.61 0.71  CALCIUM 7.8* 8.0*    PT/INR: No results for input(s): LABPROT, INR in the last 72 hours. ABG    Component Value Date/Time   PHART 7.371 02/05/2020 0457   HCO3 20.5 02/05/2020 0457   TCO2 22 02/05/2020 0457   ACIDBASEDEF 5.0 (H) 02/05/2020 0457   O2SAT 95.0 02/05/2020 0457   CBG (last 3)  No results for input(s): GLUCAP in the last 72 hours.  Assessment/Plan: S/P Procedure(s) (LRB): LEFT THORACOTOMY, DRAINAGE OF EMPYEMA, CHEST TUBE INSERTION (Left) CHEST TUBE INSERTION (Left)  POD 7 Hemodynamically stable.  Remains afebrile but persistent leukocytosis. May be inflammatory.  Chest tubes only put out 60 cc since 4 pm yesterday so will remove anterior tube today.  Continue  IV antibiotic, IS, ambulation.   LOS: 9 days    Alleen Borne 02/11/2020

## 2020-02-11 NOTE — Progress Notes (Signed)
PROGRESS NOTE    Theodus Ran  WUJ:[id number] DOB: 10-21-1960 DOA: 02/02/2020 PCP: Patient, No Pcp Per    Brief Narrative:  Mr. Borgen is a 59 y.o. M with smoking, EtOH use who presents with non-resolving pain, cough, malaise after recent pneumonia.  Patient admitted to APS last month for multifocal pneumonia, treated with IV antibiotics, and discharged with oral antibiotics.  After completion of oral antibiotics, he continued to have severe left-sided pain, malaise, chills, and cough, and so he returned to the ER.  On readmission, CT of the chest showed a large loculated pleural effusion, progressed from previous.  Thoracentesis was obtained and about 200 cc of purulent foul-smelling fluid returned, which had polymicrobial Gram stain.  Case was discussed with CT surgery, who appear to have recommended transfer for VATS.  12/2- Still draining .   12/3- one drain removed today.   Consultants:   CT surgery  Procedures: Status post VATS 11/26 by Dr. Lavinia Sharps  Antimicrobials:   Levofloxacin   Subjective: Has some pain from removal of one drain this am. Otherwise no complaints. Not too hungry but trying to drink the ensure.  Objective: Vitals:   02/10/20 2000 02/10/20 2350 02/11/20 0349 02/11/20 0758  BP: 115/67 128/69 (!) 111/58 121/63  Pulse: 88 93 91 89  Resp: 17 19 20 19   Temp: 98.8 F (37.1 C) 99.5 F (37.5 C) 99.1 F (37.3 C) 98 F (36.7 C)  TempSrc: Oral Oral Oral Oral  SpO2: 90% 92% 92% 92%  Weight:      Height:        Intake/Output Summary (Last 24 hours) at 02/11/2020 0828 Last data filed at 02/11/2020 0802 Gross per 24 hour  Intake 794 ml  Output 1730 ml  Net -936 ml   Filed Weights   02/02/20 2231 02/05/20 0500 02/07/20 0528  Weight: 68.4 kg 69.5 kg 73 kg    Examination:  Calm, comfortable patient CTA with decreased breath sounds at bases, no wheezing Regular S1-S2 no gallops Soft benign positive bowel sounds Chest to in place minimal  drainage No edema Alert oriented x3      Data Reviewed: I have personally reviewed following labs and imaging studies  CBC: Recent Labs  Lab 02/06/20 0056 02/07/20 0110 02/08/20 0054 02/09/20 0227 02/11/20 0101  WBC 31.1* 20.6* 21.7* 24.2* 22.8*  HGB 8.7* 9.2* 9.6* 9.0* 9.0*  HCT 27.3* 28.8* 30.2* 28.0* 27.4*  MCV 89.2 88.3 88.8 88.9 88.7  PLT 560* 637* 660* 647* 679*   Basic Metabolic Panel: Recent Labs  Lab 02/06/20 0056 02/07/20 0110 02/08/20 0054 02/09/20 0227 02/11/20 0101  NA 137 140 136 137 133*  K 3.2* 3.6 3.6 3.8 4.5  CL 110 109 101 101 98  CO2 18* 22 25 27 27   GLUCOSE 156* 79 86 100* 95  BUN 24* 16 12 10 9   CREATININE 0.71 0.57* 0.60* 0.61 0.71  CALCIUM 7.8* 7.8* 7.7* 7.8* 8.0*  MG  --  1.9  --   --   --    GFR: Estimated Creatinine Clearance: 90.7 mL/min (by C-G formula based on SCr of 0.71 mg/dL). Liver Function Tests: No results for input(s): AST, ALT, ALKPHOS, BILITOT, PROT, ALBUMIN in the last 168 hours. No results for input(s): LIPASE, AMYLASE in the last 168 hours. No results for input(s): AMMONIA in the last 168 hours. Coagulation Profile: No results for input(s): INR, PROTIME in the last 168 hours. Cardiac Enzymes: No results for input(s): CKTOTAL, CKMB, CKMBINDEX, TROPONINI in the last 168  hours. BNP (last 3 results) No results for input(s): PROBNP in the last 8760 hours. HbA1C: No results for input(s): HGBA1C in the last 72 hours. CBG: Recent Labs  Lab 02/05/20 1601 02/05/20 2034 02/05/20 2350 02/06/20 0408 02/06/20 0758  GLUCAP 174* 133* 142* 131* 145*   Lipid Profile: No results for input(s): CHOL, HDL, LDLCALC, TRIG, CHOLHDL, LDLDIRECT in the last 72 hours. Thyroid Function Tests: No results for input(s): TSH, T4TOTAL, FREET4, T3FREE, THYROIDAB in the last 72 hours. Anemia Panel: No results for input(s): VITAMINB12, FOLATE, FERRITIN, TIBC, IRON, RETICCTPCT in the last 72 hours. Sepsis Labs: No results for input(s):  PROCALCITON, LATICACIDVEN in the last 168 hours.  Recent Results (from the past 240 hour(s))  Resp Panel by RT-PCR (Flu A&B, Covid) Nasopharyngeal Swab     Status: None   Collection Time: 02/02/20  3:21 PM   Specimen: Nasopharyngeal Swab; Nasopharyngeal(NP) swabs in vial transport medium  Result Value Ref Range Status   SARS Coronavirus 2 by RT PCR NEGATIVE NEGATIVE Final    Comment: (NOTE) SARS-CoV-2 target nucleic acids are NOT DETECTED.  The SARS-CoV-2 RNA is generally detectable in upper respiratory specimens during the acute phase of infection. The lowest concentration of SARS-CoV-2 viral copies this assay can detect is 138 copies/mL. A negative result does not preclude SARS-Cov-2 infection and should not be used as the sole basis for treatment or other patient management decisions. A negative result may occur with  improper specimen collection/handling, submission of specimen other than nasopharyngeal swab, presence of viral mutation(s) within the areas targeted by this assay, and inadequate number of viral copies(<138 copies/mL). A negative result must be combined with clinical observations, patient history, and epidemiological information. The expected result is Negative.  Fact Sheet for Patients:  BloggerCourse.com  Fact Sheet for Healthcare Providers:  SeriousBroker.it  This test is no t yet approved or cleared by the [place] FDA and  has been authorized for detection and/or diagnosis of SARS-CoV-2 by FDA under an Emergency Use Authorization (EUA). This EUA will remain  in effect (meaning this test can be used) for the duration of the COVID-19 declaration under Section 564(b)(1) of the Act, 21 U.S.C.section 360bbb-3(b)(1), unless the authorization is terminated  or revoked sooner.       Influenza A by PCR NEGATIVE NEGATIVE Final   Influenza B by PCR NEGATIVE NEGATIVE Final    Comment: (NOTE) The Xpert Xpress  SARS-CoV-2/FLU/RSV plus assay is intended as an aid in the diagnosis of influenza from Nasopharyngeal swab specimens and should not be used as a sole basis for treatment. Nasal washings and aspirates are unacceptable for Xpert Xpress SARS-CoV-2/FLU/RSV testing.  Fact Sheet for Patients: BloggerCourse.com  Fact Sheet for Healthcare Providers: SeriousBroker.it  This test is not yet approved or cleared by the [place] FDA and has been authorized for detection and/or diagnosis of SARS-CoV-2 by FDA under an Emergency Use Authorization (EUA). This EUA will remain in effect (meaning this test can be used) for the duration of the COVID-19 declaration under Section 564(b)(1) of the Act, 21 U.S.C. section 360bbb-3(b)(1), unless the authorization is terminated or revoked.  Performed at [facility], [address], [place], [place] [address]   Blood Culture (routine x 2)     Status: None   Collection Time: 02/02/20  3:41 PM   Specimen: BLOOD  Result Value Ref Range Status   Specimen Description BLOOD  Final   Special Requests NONE  Final   Culture   Final  NO GROWTH 5 DAYS Performed at [facility], [address] [place], [place] [address]    Report Status 02/07/2020 FINAL  Final  Blood Culture (routine x 2)     Status: None   Collection Time: 02/02/20  3:41 PM   Specimen: BLOOD  Result Value Ref Range Status   Specimen Description BLOOD LEFT ANTECUBITAL  Final   Special Requests   Final    BOTTLES DRAWN AEROBIC AND ANAEROBIC Blood Culture adequate volume   Culture   Final    NO GROWTH 5 DAYS Performed at [facility], [address], [place], [place] [address]    Report Status 02/07/2020 FINAL  Final  Urine culture     Status: None   Collection Time: 02/02/20  4:40 PM   Specimen: Urine, Clean Catch  Result Value Ref Range Status   Specimen Description   Final    URINE, CLEAN CATCH Performed at [facility], [address] [place], [place] [address]    Special Requests   Final    NONE Performed at [facility], [address] [place], [place] [address]    Culture   Final    NO GROWTH Performed at [facility], [address], [place], [place] [address]    Report Status 02/04/2020 FINAL  Final  Fungus Culture With Stain     Status: None (Preliminary result)   Collection Time: 02/02/20  5:40 PM   Specimen: Lung; Pleural Fluid  Result Value Ref Range Status   Fungus Stain Final report  Final    Comment: (NOTE) Performed At: [facility] [address] [place], [place] [id number] Jolene Schimke MD ON:[id number]    Fungus (Mycology) Culture PENDING  Incomplete   Fungal Source PLEURAL  Final    Comment: Performed at [facility], [address] [place], [place] [address]  Culture, body fluid-bottle     Status: Abnormal   Collection Time: 02/02/20  5:40 PM   Specimen: Pleura  Result Value Ref Range Status   Specimen Description   Final    PLEURAL Performed at [facility], [address], [place], [place] [address]    Special Requests   Final    BOTTLES DRAWN AEROBIC AND ANAEROBIC 10CC Performed at [facility], [address] [place], [place] [address]    Gram Stain   Final    GRAM POSITIVE COCCI IN BOTH AEROBIC AND ANAEROBIC BOTTLES Gram Stain Report Called to,Read Back By and Verified With: Kennon Portela  02/02/20 MKELLY Performed at [facility], [address] [place], [place] [address]    Culture STREPTOCOCCUS CONSTELLATUS (A)  Final   Report Status 02/05/2020 FINAL  Final   Organism ID, Bacteria STREPTOCOCCUS CONSTELLATUS  Final      Susceptibility   Streptococcus constellatus - MIC*    PENICILLIN INTERMEDIATE Intermediate     CEFTRIAXONE 1 SENSITIVE Sensitive     ERYTHROMYCIN <=0.12 SENSITIVE Sensitive     LEVOFLOXACIN <=0.25 SENSITIVE Sensitive     VANCOMYCIN 0.25 SENSITIVE Sensitive     * STREPTOCOCCUS CONSTELLATUS  Gram stain     Status: None   Collection  Time: 02/02/20  5:40 PM   Specimen: Pleura  Result Value Ref Range Status   Specimen Description PLEURAL  Final   Special Requests NONE  Final   Gram Stain   Final    ABUNDANT GRAM POSITIVE COCCI RARE GRAM POSITIVE RODS WBC PRESENT, PREDOMINANTLY MONONUCLEAR Gram Stain Report Called to,Read Back By and Verified With: TONI  [email or web address]  02/02/20 BY JONES,T Performed at [facility], [address] [place], [place] [address]    Report Status 02/02/2020 FINAL  Final  Fungus Culture Result     Status: None   Collection Time: 02/02/20  5:40 PM  Result Value Ref Range Status   Result 1 Comment  Final    Comment: (NOTE) KOH/Calcofluor preparation:  no fungus observed. Performed At: [facility] [address] [place], [place] [id number] Jolene SchimkeNagendra Sanjai MD XB:[id number]:[phone]   Surgical pcr screen     Status: None   Collection Time: 02/02/20 10:39 PM   Specimen: Nasal Mucosa; Nasal Swab  Result Value Ref Range Status   MRSA, PCR NEGATIVE NEGATIVE Final   Staphylococcus aureus NEGATIVE NEGATIVE Final    Comment: (NOTE) The Xpert SA Assay (FDA approved for NASAL specimens in patients 61 years of age and older), is one component of a comprehensive surveillance program. It is not intended to diagnose infection nor to guide or monitor treatment. Performed at [facility], [address] [place], [place] [address]   Aerobic/Anaerobic Culture (surgical/deep wound)     Status: None   Collection Time: 02/04/20  4:32 PM   Specimen: PATH Other; Body Fluid  Result Value Ref Range Status   Specimen Description FLUID PLEURAL LEFT  Final   Special Requests NONE  Final   Gram Stain   Final    MODERATE WBC PRESENT,BOTH PMN AND MONONUCLEAR ABUNDANT GRAM POSITIVE COCCI MODERATE GRAM NEGATIVE RODS    Culture   Final    RARE VIRIDANS STREPTOCOCCUS FEW PREVOTELLA LOESCHEII BETA LACTAMASE POSITIVE Performed at [facility], [address] [place], [place] [address]    Report  Status 02/10/2020 FINAL  Final   Organism ID, Bacteria VIRIDANS STREPTOCOCCUS  Final      Susceptibility   Viridans streptococcus - MIC*    PENICILLIN INTERMEDIATE Intermediate     CEFTRIAXONE 2 INTERMEDIATE Intermediate     ERYTHROMYCIN <=0.12 SENSITIVE Sensitive     LEVOFLOXACIN 0.5 SENSITIVE Sensitive     VANCOMYCIN 0.25 SENSITIVE Sensitive     * RARE VIRIDANS STREPTOCOCCUS  Acid Fast Smear (AFB)     Status: None   Collection Time: 02/04/20  4:32 PM   Specimen: PATH Other; Body Fluid  Result Value Ref Range Status   AFB Specimen Processing Concentration  Final   Acid Fast Smear Negative  Final    Comment: (NOTE) Performed At: [facility] [address] [place], [place] [id number] Jolene SchimkeNagendra Sanjai MD KG:[id number]:[phone]    Source (AFB) FLUID  Final    Comment: LEFT PLEURAL Performed at [facility], [address] [place], [place] [address]   Aerobic/Anaerobic Culture (surgical/deep wound)     Status: None   Collection Time: 02/04/20  5:21 PM   Specimen: PATH Soft tissue resection  Result Value Ref Range Status   Specimen Description TISSUE PLEURAL  Final   Special Requests PLEURAL PEEL PT ON VANC  Final   Gram Stain   Final    FEW WBC PRESENT,BOTH PMN AND MONONUCLEAR MODERATE GRAM POSITIVE COCCI FEW GRAM NEGATIVE RODS    Culture   Final    RARE VIRIDANS STREPTOCOCCUS SUSCEPTIBILITIES PERFORMED ON PREVIOUS CULTURE WITHIN THE LAST 5 DAYS. FEW PREVOTELLA LOESCHEII BETA LACTAMASE POSITIVE Performed at [facility], [address] [place], [place] [address]    Report Status 02/10/2020 FINAL  Final         Radiology Studies: DG Chest 1 View  Result Date: 02/10/2020 CLINICAL DATA:  Pleural effusion EXAM: CHEST  1 VIEW COMPARISON:  February 09, 2020 FINDINGS: Chest tubes again noted on the left, unchanged in position. Persistent apparent loculated pleural effusion left base with areas of ill-defined opacity left mid and lower lung regions. Apparent  scarring with atelectasis right mid lung appears stable. Equivocal small right pleural effusion. Heart is upper normal in size with pulmonary vascularity normal. No adenopathy. No bone lesions. IMPRESSION: Stable chest tube positioning on the left without pneumothorax. Suspect combination of pneumonia and loculated pleural effusion on the left. Equivocal pleural effusion on the right. Stable presumed combination of scarring and atelectasis right mid lung. No new opacity evident. Stable cardiac silhouette. Electronically Signed   By: Bretta Bang III M.D.   On: 02/10/2020 08:49        Scheduled Meds: . Chlorhexidine Gluconate Cloth  6 each Topical Daily  . feeding supplement  237 mL Oral BID BM  . folic acid  1 mg Oral Daily  . guaiFENesin  600 mg Oral BID  . mouth rinse  15 mL Mouth Rinse BID  . morphine   Intravenous Q4H  . multivitamin with minerals  1 tablet Oral Daily  . nicotine  14 mg Transdermal Daily  . polyethylene glycol  17 g Oral Daily  . senna-docusate  1 tablet Oral QHS  . thiamine  100 mg Oral Daily   Continuous Infusions: . levofloxacin (LEVAQUIN) IV 750 mg (02/10/20 1412)    Assessment & Plan:   Principal Problem:   Sepsis (HCC) Active Problems:   Multifocal pneumonia with Lt Sided Pleural Effusion   Normocytic anemia/iron deficiency noted   Protein calorie malnutrition/HypoAlbulminemia   COPD and Tobacco abuse   Alcohol Abuse- Quit 11/2019   Poor dentition-Carries   COPD (chronic obstructive pulmonary disease) ---Smoker   Lt Empyema lung -loculated infected left pleural fluid consistent with empyema   S/P thoracotomy   Pressure injury of skin   Empyema S/p VATS 11/26 by Dr. Lavinia Sharps Afebrile, leukocytosis down a little bit Chest tube with decreased output, plan to remove anterior tube per CT search Pleural cx with rare viridans strep and few prevotella Loescheii. Pharmacy recommended adding Flagyl to current regimen Continue Levaquin I-S and  ambulation     Smoking Was counseled on smoking cessation  On nicotine patch here    Suspected COPD No acute exacerbation Will need a formal PFT evaluation/diagnosis with PCP as outpatient  Continue inhaler    History of alcohol use Continue thiamine and folic acid   Anemia of chronic disease Hemoglobin stable continue to monitor  Hypokalemia  remain stable  DVT prophylaxis: SCD Code Status: Full Family Communication: None at bedside  Status is: Inpatient  Remains inpatient appropriate because:Inpatient level of care appropriate due to severity of illness   Dispo: The patient is from: Home              Anticipated d/c is to: TBD              Anticipated d/c date is: > 3 days              Patient currently is not medically stable to d/c. Chest tube needs to be removed, PT evalve pending.Disposition per CTS.            LOS: 9 days   Time spent: 35 min with >50% on coc    Lynn Ito, MD [facility] Pager [phone]  If 7PM-7AM, please contact night-coverage  [email or web address] Password [facility] 02/11/2020, 8:28 AM

## 2020-02-12 ENCOUNTER — Inpatient Hospital Stay (HOSPITAL_COMMUNITY): Payer: Medicaid - Out of State

## 2020-02-12 DIAGNOSIS — J189 Pneumonia, unspecified organism: Secondary | ICD-10-CM | POA: Diagnosis not present

## 2020-02-12 DIAGNOSIS — F1010 Alcohol abuse, uncomplicated: Secondary | ICD-10-CM | POA: Diagnosis not present

## 2020-02-12 DIAGNOSIS — A419 Sepsis, unspecified organism: Secondary | ICD-10-CM | POA: Diagnosis not present

## 2020-02-12 DIAGNOSIS — J449 Chronic obstructive pulmonary disease, unspecified: Secondary | ICD-10-CM | POA: Diagnosis not present

## 2020-02-12 MED ORDER — ACETAMINOPHEN 325 MG PO TABS: 650.0000 mg | ORAL_TABLET | Freq: Four times a day (QID) | ORAL | Status: DC | PRN

## 2020-02-12 NOTE — Progress Notes (Addendum)
      [address]       [place] [address]             [phone]       8 Days Post-Op Procedure(s) (LRB): LEFT THORACOTOMY, DRAINAGE OF EMPYEMA, CHEST TUBE INSERTION (Left) CHEST TUBE INSERTION (Left)  Subjective: Patient sitting in bed this am.   Objective: Vital signs in last 24 hours: Temp:  [98.3 F (36.8 C)-100.1 F (37.8 C)] 99.2 F (37.3 C) (12/04 0725) Pulse Rate:  [87-92] 88 (12/04 0725) Cardiac Rhythm: Normal sinus rhythm;Bundle branch block (12/04 0700) Resp:  [14-23] 20 (12/04 0809) BP: (108-127)/(64-97) 127/64 (12/04 0725) SpO2:  [90 %-98 %] 90 % (12/04 0809) FiO2 (%):  [92 %] 92 % (12/03 1617)     Intake/Output from previous day: 12/03 0701 - 12/04 0700 In: 240 [P.O.:240] Out: 2890 [Urine:2700; Chest Tube:190]   Physical Exam:  Cardiovascular: RRR, Pulmonary: Clear to auscultation on right and slightly diminished on the left base Abdomen: Soft, non tender, bowel sounds present. Extremities: No LE edema Wounds: Clean and dry.  Minor erythema around staple edges Chest Tube: to suction, no air leak  Lab Results: QGB:EEFEOF Labs    02/11/20 0101  WBC 22.8*  HGB 9.0*  HCT 27.4*  PLT 679*   BMET:  Recent Labs    02/11/20 0101  NA 133*  K 4.5  CL 98  CO2 27  GLUCOSE 95  BUN 9  CREATININE 0.71  CALCIUM 8.0*    PT/INR: No results for input(s): LABPROT, INR in the last 72 hours. ABG:  INR: Will add last result for INR, ABG once components are confirmed Will add last 4 CBG results once components are confirmed  Assessment/Plan:  1. CV - SR. 2.  Pulmonary - History of COPD. On room air. Chest tube with 190 cc last 24 hours. Chest tube is to suction and there is no air leak. CXR this am is stable (no pneumothorax, decreased left opacity. Hope to remove last chest tube in am if output decreased. Encourage incentive spirometer. 3. ID-WBC yesterday 22,800. Will recheck in am. On Levofloxacin IV and Flagyl PO for Strep  Viridans, few Prevotella Loescheii  Reynard Christoffersen M ZimmermanPA-C 02/12/2020,10:06 AM [phone]

## 2020-02-12 NOTE — Progress Notes (Signed)
PROGRESS NOTE    Francisco Pittman  QZE:[id number] DOB: June 18, 1960 DOA: 02/02/2020 PCP: Patient, No Pcp Per    Brief Narrative:  [name] is a 59 y.o. M with smoking, EtOH use who presents with non-resolving pain, cough, malaise after recent pneumonia.  Patient admitted to [facility]PS last month for multifocal pneumonia, treated with IV antibiotics, and discharged with oral antibiotics.  After completion of oral antibiotics, he continued to have severe left-sided pain, malaise, chills, and cough, and so he returned to the ER.  On readmission, CT of the chest showed a large loculated pleural effusion, progressed from previous.  Thoracentesis was obtained and about 200 cc of purulent foul-smelling fluid returned, which had polymicrobial Gram stain. CTS on board, placed VATS. One CT out on 02/11/20. On ivabx. F/u CTS recommendations.  12/4- One CT drain removed on 11/3. cxr this am no PTX. Tmax 100.1  Consultants:   CT surgery  Procedures: Status post VATS 11/26 by Dr. Lavinia Sharps  Antimicrobials:   Levofloxacin   Subjective: Has no complaints of sob, cp, abd pain, fever or chills this am  Objective: Vitals:   02/11/20 2350 02/11/20 2354 02/12/20 0335 02/12/20 0725  BP:  121/64 119/67 127/64  Pulse:  89 88   Resp: 19 18 14 20   Temp:  99.1 F (37.3 C) 100.1 F (37.8 C) 99.2 F (37.3 C)  TempSrc:  Oral Oral Oral  SpO2: 95% 93% 92%   Weight:      Height:        Intake/Output Summary (Last 24 hours) at 02/12/2020 0751 Last data filed at 02/12/2020 0504 Gross per 24 hour  Intake 240 ml  Output 2890 ml  Net -2650 ml   Filed Weights   02/02/20 2231 02/05/20 0500 02/07/20 0528  Weight: 68.4 kg 69.5 kg 73 kg    Examination:  Aaxox3, nad Decrease bs at bases, no w/r/r Regular s1/s2 no gallop Soft benign, +bs No edema CT in place, with mild drainage. Mood and affect appropriate in current setting      Data Reviewed: I have personally reviewed following labs and imaging  studies  CBC: Recent Labs  Lab 02/06/20 0056 02/07/20 0110 02/08/20 0054 02/09/20 0227 02/11/20 0101  WBC 31.1* 20.6* 21.7* 24.2* 22.8*  HGB 8.7* 9.2* 9.6* 9.0* 9.0*  HCT 27.3* 28.8* 30.2* 28.0* 27.4*  MCV 89.2 88.3 88.8 88.9 88.7  PLT 560* 637* 660* 647* 679*   Basic Metabolic Panel: Recent Labs  Lab 02/06/20 0056 02/07/20 0110 02/08/20 0054 02/09/20 0227 02/11/20 0101  NA 137 140 136 137 133*  K 3.2* 3.6 3.6 3.8 4.5  CL 110 109 101 101 98  CO2 18* 22 25 27 27   GLUCOSE 156* 79 86 100* 95  BUN 24* 16 12 10 9   CREATININE 0.71 0.57* 0.60* 0.61 0.71  CALCIUM 7.8* 7.8* 7.7* 7.8* 8.0*  MG  --  1.9  --   --   --    GFR: Estimated Creatinine Clearance: 90.7 mL/min (by C-G formula based on SCr of 0.71 mg/dL). Liver Function Tests: No results for input(s): AST, ALT, ALKPHOS, BILITOT, PROT, ALBUMIN in the last 168 hours. No results for input(s): LIPASE, AMYLASE in the last 168 hours. No results for input(s): AMMONIA in the last 168 hours. Coagulation Profile: No results for input(s): INR, PROTIME in the last 168 hours. Cardiac Enzymes: No results for input(s): CKTOTAL, CKMB, CKMBINDEX, TROPONINI in the last 168 hours. BNP (last 3 results) No results for input(s): PROBNP in the last  8760 hours. HbA1C: No results for input(s): HGBA1C in the last 72 hours. CBG: Recent Labs  Lab 02/05/20 1601 02/05/20 2034 02/05/20 2350 02/06/20 0408 02/06/20 0758  GLUCAP 174* 133* 142* 131* 145*   Lipid Profile: No results for input(s): CHOL, HDL, LDLCALC, TRIG, CHOLHDL, LDLDIRECT in the last 72 hours. Thyroid Function Tests: No results for input(s): TSH, T4TOTAL, FREET4, T3FREE, THYROIDAB in the last 72 hours. Anemia Panel: No results for input(s): VITAMINB12, FOLATE, FERRITIN, TIBC, IRON, RETICCTPCT in the last 72 hours. Sepsis Labs: No results for input(s): PROCALCITON, LATICACIDVEN in the last 168 hours.  Recent Results (from the past 240 hour(s))  Resp Panel by RT-PCR (Flu  A&B, Covid) Nasopharyngeal Swab     Status: None   Collection Time: 02/02/20  3:21 PM   Specimen: Nasopharyngeal Swab; Nasopharyngeal(NP) swabs in vial transport medium  Result Value Ref Range Status   SARS Coronavirus 2 by RT PCR NEGATIVE NEGATIVE Final    Comment: (NOTE) SARS-CoV-2 target nucleic acids are NOT DETECTED.  The SARS-CoV-2 RNA is generally detectable in upper respiratory specimens during the acute phase of infection. The lowest concentration of SARS-CoV-2 viral copies this assay can detect is 138 copies/mL. A negative result does not preclude SARS-Cov-2 infection and should not be used as the sole basis for treatment or other patient management decisions. A negative result may occur with  improper specimen collection/handling, submission of specimen other than nasopharyngeal swab, presence of viral mutation(s) within the areas targeted by this assay, and inadequate number of viral copies(<138 copies/mL). A negative result must be combined with clinical observations, patient history, and epidemiological information. The expected result is Negative.  Fact Sheet for Patients:  BloggerCourse.com  Fact Sheet for Healthcare Providers:  SeriousBroker.it  This test is no t yet approved or cleared by the [place] FDA and  has been authorized for detection and/or diagnosis of SARS-CoV-2 by FDA under an Emergency Use Authorization (EUA). This EUA will remain  in effect (meaning this test can be used) for the duration of the COVID-19 declaration under Section 564(b)(1) of the Act, 21 U.S.C.section 360bbb-3(b)(1), unless the authorization is terminated  or revoked sooner.       Influenza A by PCR NEGATIVE NEGATIVE Final   Influenza B by PCR NEGATIVE NEGATIVE Final    Comment: (NOTE) The Xpert Xpress SARS-CoV-2/FLU/RSV plus assay is intended as an aid in the diagnosis of influenza from Nasopharyngeal swab specimens  and should not be used as a sole basis for treatment. Nasal washings and aspirates are unacceptable for Xpert Xpress SARS-CoV-2/FLU/RSV testing.  Fact Sheet for Patients: BloggerCourse.com  Fact Sheet for Healthcare Providers: SeriousBroker.it  This test is not yet approved or cleared by the [place] FDA and has been authorized for detection and/or diagnosis of SARS-CoV-2 by FDA under an Emergency Use Authorization (EUA). This EUA will remain in effect (meaning this test can be used) for the duration of the COVID-19 declaration under Section 564(b)(1) of the Act, 21 U.S.C. section 360bbb-3(b)(1), unless the authorization is terminated or revoked.  Performed at [facility], [address], [place], [place] [address]   Blood Culture (routine x 2)     Status: None   Collection Time: 02/02/20  3:41 PM   Specimen: BLOOD  Result Value Ref Range Status   Specimen Description BLOOD  Final   Special Requests NONE  Final   Culture   Final    NO GROWTH 5 DAYS Performed at [facility], [address] [place],  [place] [address]    Report Status 02/07/2020 FINAL  Final  Blood Culture (routine x 2)     Status: None   Collection Time: 02/02/20  3:41 PM   Specimen: BLOOD  Result Value Ref Range Status   Specimen Description BLOOD LEFT ANTECUBITAL  Final   Special Requests   Final    BOTTLES DRAWN AEROBIC AND ANAEROBIC Blood Culture adequate volume   Culture   Final    NO GROWTH 5 DAYS Performed at [facility], [address], [place], [place] [address]    Report Status 02/07/2020 FINAL  Final  Urine culture     Status: None   Collection Time: 02/02/20  4:40 PM   Specimen: Urine, Clean Catch  Result Value Ref Range Status   Specimen Description   Final    URINE, CLEAN CATCH Performed at [facility], [address], [place], [place] [address]    Special Requests   Final    NONE Performed at [facility], [address]  [place], [place] [address]    Culture   Final    NO GROWTH Performed at [facility], [address] [place], [place] [address]    Report Status 02/04/2020 FINAL  Final  Fungus Culture With Stain     Status: None (Preliminary result)   Collection Time: 02/02/20  5:40 PM   Specimen: Lung; Pleural Fluid  Result Value Ref Range Status   Fungus Stain Final report  Final    Comment: (NOTE) Performed At: [facility] [address] [place], [place] [id number] Jolene Schimke MD BH:[id number]    Fungus (Mycology) Culture PENDING  Incomplete   Fungal Source PLEURAL  Final    Comment: Performed at [facility], [address], [place], [place] [address]  Culture, body fluid-bottle     Status: Abnormal   Collection Time: 02/02/20  5:40 PM   Specimen: Pleura  Result Value Ref Range Status   Specimen Description   Final    PLEURAL Performed at [facility], [address], [place], [place] [address]    Special Requests   Final    BOTTLES DRAWN AEROBIC AND ANAEROBIC 10CC Performed at [facility], [address] [place], [place] [address]    Gram Stain   Final    GRAM POSITIVE COCCI IN BOTH AEROBIC AND ANAEROBIC BOTTLES Gram Stain Report Called to,Read Back By and Verified With: Kennon Portela @2140  02/02/20 MKELLY Performed at [facility], [address] [place], [place]    Culture STREPTOCOCCUS CONSTELLATUS (A)  Final   Report Status 02/05/2020 FINAL  Final   Organism ID, Bacteria STREPTOCOCCUS CONSTELLATUS  Final      Susceptibility   Streptococcus constellatus - MIC*    PENICILLIN INTERMEDIATE Intermediate     CEFTRIAXONE 1 SENSITIVE Sensitive     ERYTHROMYCIN <=0.12 SENSITIVE Sensitive     LEVOFLOXACIN <=0.25 SENSITIVE Sensitive     VANCOMYCIN 0.25 SENSITIVE Sensitive     * STREPTOCOCCUS CONSTELLATUS  Gram stain     Status: None   Collection Time: 02/02/20  5:40 PM   Specimen: Pleura  Result Value Ref Range Status   Specimen Description PLEURAL  Final    Special Requests NONE  Final   Gram Stain   Final    ABUNDANT GRAM POSITIVE COCCI RARE GRAM POSITIVE RODS WBC PRESENT, PREDOMINANTLY MONONUCLEAR Gram Stain Report Called to,Read Back By and Verified With: TONI [email or web address]  02/02/20 BY JONES,T Performed at [facility], [address], [place],  [place] [address]    Report Status 02/02/2020 FINAL  Final  Fungus Culture Result     Status: None   Collection Time: 02/02/20  5:40 PM  Result Value Ref Range Status   Result 1 Comment  Final    Comment: (NOTE) KOH/Calcofluor preparation:  no fungus observed. Performed At: [facility] [address] [place], [place] [id number] Jolene SchimkeNagendra Sanjai MD VO:[phone]   Surgical pcr screen     Status: None   Collection Time: 02/02/20 10:39 PM   Specimen: Nasal Mucosa; Nasal Swab  Result Value Ref Range Status   MRSA, PCR NEGATIVE NEGATIVE Final   Staphylococcus aureus NEGATIVE NEGATIVE Final    Comment: (NOTE) The Xpert SA Assay (FDA approved for NASAL specimens in patients 61 years of age and older), is one component of a comprehensive surveillance program. It is not intended to diagnose infection nor to guide or monitor treatment. Performed at [facility], [address] [place], [place] [address]   Aerobic/Anaerobic Culture (surgical/deep wound)     Status: None   Collection Time: 02/04/20  4:32 PM   Specimen: PATH Other; Body Fluid  Result Value Ref Range Status   Specimen Description FLUID PLEURAL LEFT  Final   Special Requests NONE  Final   Gram Stain   Final    MODERATE WBC PRESENT,BOTH PMN AND MONONUCLEAR ABUNDANT GRAM POSITIVE COCCI MODERATE GRAM NEGATIVE RODS    Culture   Final    RARE VIRIDANS STREPTOCOCCUS FEW PREVOTELLA LOESCHEII BETA LACTAMASE POSITIVE Performed at [facility], [address] [place], [place] [address]    Report Status 02/10/2020 FINAL  Final   Organism ID, Bacteria VIRIDANS STREPTOCOCCUS  Final      Susceptibility   Viridans  streptococcus - MIC*    PENICILLIN INTERMEDIATE Intermediate     CEFTRIAXONE 2 INTERMEDIATE Intermediate     ERYTHROMYCIN <=0.12 SENSITIVE Sensitive     LEVOFLOXACIN 0.5 SENSITIVE Sensitive     VANCOMYCIN 0.25 SENSITIVE Sensitive     * RARE VIRIDANS STREPTOCOCCUS  Acid Fast Smear (AFB)     Status: None   Collection Time: 02/04/20  4:32 PM   Specimen: PATH Other; Body Fluid  Result Value Ref Range Status   AFB Specimen Processing Concentration  Final   Acid Fast Smear Negative  Final    Comment: (NOTE) Performed At: [facility] [address] [place], [place] [id number] Jolene SchimkeNagendra Sanjai MD AC:[id number]    Source (AFB) FLUID  Final    Comment: LEFT PLEURAL Performed at [facility], [address] [place], [place] [address]   Aerobic/Anaerobic Culture (surgical/deep wound)     Status: None   Collection Time: 02/04/20  5:21 PM   Specimen: PATH Soft tissue resection  Result Value Ref Range Status   Specimen Description TISSUE PLEURAL  Final   Special Requests PLEURAL PEEL PT ON VANC  Final   Gram Stain   Final    FEW WBC PRESENT,BOTH PMN AND MONONUCLEAR MODERATE GRAM POSITIVE COCCI FEW GRAM NEGATIVE RODS    Culture   Final    RARE VIRIDANS STREPTOCOCCUS SUSCEPTIBILITIES PERFORMED ON PREVIOUS CULTURE WITHIN THE LAST 5 DAYS. FEW PREVOTELLA LOESCHEII BETA LACTAMASE POSITIVE Performed at [facility], [address] [place], [place] [address]    Report Status 02/10/2020 FINAL  Final         Radiology Studies: DG Chest 1 View  Result Date: 02/10/2020 CLINICAL DATA:  Pleural effusion EXAM: CHEST  1  VIEW COMPARISON:  February 09, 2020 FINDINGS: Chest tubes again noted on the left, unchanged in position. Persistent apparent loculated pleural effusion left base with areas of ill-defined opacity left mid and lower lung regions. Apparent scarring with atelectasis right mid lung appears stable. Equivocal small right pleural effusion. Heart is upper normal in  size with pulmonary vascularity normal. No adenopathy. No bone lesions. IMPRESSION: Stable chest tube positioning on the left without pneumothorax. Suspect combination of pneumonia and loculated pleural effusion on the left. Equivocal pleural effusion on the right. Stable presumed combination of scarring and atelectasis right mid lung. No new opacity evident. Stable cardiac silhouette. Electronically Signed   By: Bretta Bang III M.D.   On: 02/10/2020 08:49        Scheduled Meds: . Chlorhexidine Gluconate Cloth  6 each Topical Daily  . feeding supplement  237 mL Oral BID BM  . folic acid  1 mg Oral Daily  . guaiFENesin  600 mg Oral BID  . mouth rinse  15 mL Mouth Rinse BID  . metroNIDAZOLE  500 mg Oral Q8H  . morphine   Intravenous Q4H  . multivitamin with minerals  1 tablet Oral Daily  . nicotine  14 mg Transdermal Daily  . polyethylene glycol  17 g Oral Daily  . senna-docusate  1 tablet Oral QHS  . thiamine  100 mg Oral Daily   Continuous Infusions: . levofloxacin (LEVAQUIN) IV 750 mg (02/11/20 1613)    Assessment & Plan:   Principal Problem:   Sepsis (HCC) Active Problems:   Multifocal pneumonia with Lt Sided Pleural Effusion   Normocytic anemia/iron deficiency noted   Protein calorie malnutrition/HypoAlbulminemia   COPD and Tobacco abuse   Alcohol Abuse- Quit 11/2019   Poor dentition-Carries   COPD (chronic obstructive pulmonary disease) ---Smoker   Lt Empyema lung -loculated infected left pleural fluid consistent with empyema   S/P thoracotomy   Pressure injury of skin   Empyema S/p VATS 11/26 by Dr. Lavinia Sharps With low-grade fever, T-max 100.1 Leukocytosis decreasing 1 chest tube drain removed on 12/3, possible removal of last chest tube in a.m. if output decreases Continue Levaquin "Chest x-ray with retroperitoneum strep and few Prevotella Loesc. -per pharmacy to add flagyl . IS       Smoking Was counseled on smoking cessation  Continue nicotine  patch    Suspected COPD No acute exacerbation Will need formal PFT evaluation/diagnosis with PCP as outpatient  Continue inhalers     History of alcohol use Continue thiamine and folic acid   Anemia of chronic disease Hemoglobin stable continue to monitor  Hypokalemia  remain stable  DVT prophylaxis: SCD Code Status: Full Family Communication: None at bedside  Status is: Inpatient  Remains inpatient appropriate because:Inpatient level of care appropriate due to severity of illness   Dispo: The patient is from: Home              Anticipated d/c is to: TBD              Anticipated d/c date is: > 3 days              Patient currently is not medically stable to d/c. Chest tube needs to be removed, PT evalve pending.Disposition per CTS.            LOS: 10 days   Time spent: 35 min with >50% on coc    Lynn Ito, MD [facility] Pager [phone]  If 7PM-7AM, please contact night-coverage [email or web address]  Password [email or web address] 02/12/2020, 7:51 AM

## 2020-02-13 ENCOUNTER — Inpatient Hospital Stay (HOSPITAL_COMMUNITY): Payer: Medicaid - Out of State

## 2020-02-13 DIAGNOSIS — J869 Pyothorax without fistula: Secondary | ICD-10-CM | POA: Diagnosis not present

## 2020-02-13 DIAGNOSIS — J449 Chronic obstructive pulmonary disease, unspecified: Secondary | ICD-10-CM | POA: Diagnosis not present

## 2020-02-13 DIAGNOSIS — J189 Pneumonia, unspecified organism: Secondary | ICD-10-CM | POA: Diagnosis not present

## 2020-02-13 DIAGNOSIS — Z720 Tobacco use: Secondary | ICD-10-CM | POA: Diagnosis not present

## 2020-02-13 LAB — CBC
HCT: 23.9 % — ABNORMAL LOW (ref 39.0–52.0)
Hemoglobin: 7.7 g/dL — ABNORMAL LOW (ref 13.0–17.0)
MCH: 28.1 pg (ref 26.0–34.0)
MCHC: 32.2 g/dL (ref 30.0–36.0)
MCV: 87.2 fL (ref 80.0–100.0)
Platelets: 684 10*3/uL — ABNORMAL HIGH (ref 150–400)
RBC: 2.74 MIL/uL — ABNORMAL LOW (ref 4.22–5.81)
RDW: 14.4 % (ref 11.5–15.5)
WBC: 18.5 10*3/uL — ABNORMAL HIGH (ref 4.0–10.5)
nRBC: 0 % (ref 0.0–0.2)

## 2020-02-13 NOTE — Progress Notes (Addendum)
      [address]       [place] [address]             [phone]       9 Days Post-Op Procedure(s) (LRB): LEFT THORACOTOMY, DRAINAGE OF EMPYEMA, CHEST TUBE INSERTION (Left) CHEST TUBE INSERTION (Left)  Subjective: Patient sitting on edge of bed, eating breakfast. He has no specific complaint.  Objective: Vital signs in last 24 hours: Temp:  [98.8 F (37.1 C)-99.2 F (37.3 C)] 99 F (37.2 C) (12/05 0335) Pulse Rate:  [86-93] 91 (12/05 0335) Cardiac Rhythm: Normal sinus rhythm (12/04 1900) Resp:  [12-20] 18 (12/05 0335) BP: (107-126)/(43-64) 107/59 (12/05 0335) SpO2:  [90 %-98 %] 94 % (12/05 0335)     Intake/Output from previous day: 12/04 0701 - 12/05 0700 In: 960 [P.O.:960] Out: 2760 [Urine:2700; Chest Tube:60]   Physical Exam:  Cardiovascular: RRR, Pulmonary: Clear to auscultation on right and slightly diminished on the left base Abdomen: Soft, non tender, bowel sounds present. Extremities: No LE edema Wounds: Clean and dry.  Minor erythema around staple edges Chest Tube: to suction, no air leak  Lab Results: CBC: Recent Labs    02/11/20 0101 02/13/20 0048  WBC 22.8* 18.5*  HGB 9.0* 7.7*  HCT 27.4* 23.9*  PLT 679* 684*   BMET:  Recent Labs    02/11/20 0101  NA 133*  K 4.5  CL 98  CO2 27  GLUCOSE 95  BUN 9  CREATININE 0.71  CALCIUM 8.0*    PT/INR: No results for input(s): LABPROT, INR in the last 72 hours. ABG:  INR: Will add last result for INR, ABG once components are confirmed Will add last 4 CBG results once components are confirmed  Assessment/Plan:  1. CV - SR. 2.  Pulmonary - History of COPD. On room air. Chest tube with 60 cc last 24 hours. Chest tube is to suction and there is no air leak. CXR this am is stable (no pneumothorax,  left opacity. Hope to remove last chest tube in am. Encourage incentive spirometer. 3. ID-WBC this am decreased to 18,500. Will recheck in am. On Levofloxacin IV and Flagyl PO for  Strep Viridans, few Prevotella Loescheii 4. Anemia-H and H this am decreased to 7.7 and 23.9  Donielle M ZimmermanPA-C 02/13/2020,9:44 AM [phone]  I have seen and examined the patient and agree with the assessment and plan as outlined.  Will d/c tube.  Purcell Nails, MD 02/13/2020 2:16 PM

## 2020-02-13 NOTE — Progress Notes (Addendum)
Progress Note    Francisco Pittman  NWG:[id number] DOB: 01/01/1961  DOA: 02/02/2020 PCP: Patient, No Pcp Per    Brief Narrative:    Medical records reviewed and are as summarized below:  Francisco Pittman is an 59 y.o. male h/o smoking, EtOH use who presents with non-resolving pain, cough, malaise after recent pneumonia. Patient admitted to [facility] last month for multifocal pneumonia, treated with IV antibiotics, and discharged with oral antibiotics. After completion of oral antibiotics, he continued to have severe left-sided pain, malaise, chills, and cough, and so he returned to the ER. On readmission, CT of the chest showed a large loculated pleural effusion.  S/p thoracotomy with drainage of empyema and lung decortication.  Had 2 chest tubes, 1 has since been removed by CVTS.    Assessment/Plan:   Active Problems:   Multifocal pneumonia with Lt Sided Pleural Effusion   Normocytic anemia/iron deficiency noted   COPD and Tobacco abuse   Alcohol Abuse- Quit 11/2019   Poor dentition-Carries   COPD (chronic obstructive pulmonary disease) ---Smoker   Lt Empyema lung -loculated infected left pleural fluid consistent with empyema   S/P thoracotomy   Pressure injury of skin   Empyema: Strep Viridans, few Prevotella Loescheii S/p VATS 11/26 by Dr. Laneta Simmers Leukocytosis decreasing - one chest tube removed on 12/3, possible removal of last chest tube 12/5 per CVTS Levaquin/flagyl -ambulate -incentive spirometry -elevated wBC count-- trend wbcs  Smoking -continue to encourage cessation  Suspected COPD No acute exacerbation Will need formal PFT evaluation/diagnosis with PCP as outpatient  Continue inhalers   History of alcohol use -Continue thiamine and folic acid  Anemia of chronic disease -Hgb trending down -CBC in AM  Hypokalemia -replete  Pressure Injury 02/04/20 Buttocks Left Stage 1 -  Intact skin with non-blanchable redness of a localized area usually over a bony  prominence. quarter sized non blanchable area (Active)  02/04/20 2000  Location: Buttocks  Location Orientation: Left  Staging: Stage 1 -  Intact skin with non-blanchable redness of a localized area usually over a bony prominence.  Wound Description (Comments): quarter sized non blanchable area  Present on Admission: Yes     Family Communication/Anticipated D/C date and plan/Code Status   DVT prophylaxis: scd Code Status: Full Code.  Disposition Plan: Status is: Inpatient  Remains inpatient appropriate because:Inpatient level of care appropriate due to severity of illness   Dispo: The patient is from: Home              Anticipated d/c is to: Home              Anticipated d/c date is: 2 days              Patient currently is not medically stable to d/c.         Medical Consultants:    CVTS    Subjective:   sleeping  Objective:    Vitals:   02/13/20 0328 02/13/20 0335 02/13/20 0800 02/13/20 1028  BP:  (!) 107/59  104/64  Pulse:  91  97  Resp: 12 18 16 17   Temp:  99 F (37.2 C)  98.7 F (37.1 C)  TempSrc:  Oral  Oral  SpO2: 94% 94% 96% 95%  Weight:      Height:        Intake/Output Summary (Last 24 hours) at 02/13/2020 1110 Last data filed at 02/13/2020 0335 Gross per 24 hour  Intake 760 ml  Output 2340 ml  Net -1580 ml  Filed Weights   02/02/20 2231 02/05/20 0500 02/07/20 0528  Weight: 68.4 kg 69.5 kg 73 kg    Exam:  General: Appearance:     Overweight male in no acute distress  Eyes:    PERRL, conjunctiva/corneas clear, EOM's intact       Lungs:     Clear to auscultation bilaterally, respirations unlabored  Heart:    Normal heart rate. Normal rhythm. No murmurs, rubs, or gallops.   MS:   All extremities are intact.   Neurologic:   Awake, alert, oriented x 3. No apparent focal neurological           defect.     Data Reviewed:   I have personally reviewed following labs and imaging studies:  Labs: Labs show the following:   Basic  Metabolic Panel: Recent Labs  Lab 02/07/20 0110 02/07/20 0110 02/08/20 0054 02/08/20 0054 02/09/20 0227 02/11/20 0101  NA 140  --  136  --  137 133*  K 3.6   < > 3.6   < > 3.8 4.5  CL 109  --  101  --  101 98  CO2 22  --  25  --  27 27  GLUCOSE 79  --  86  --  100* 95  BUN 16  --  12  --  10 9  CREATININE 0.57*  --  0.60*  --  0.61 0.71  CALCIUM 7.8*  --  7.7*  --  7.8* 8.0*  MG 1.9  --   --   --   --   --    < > = values in this interval not displayed.   GFR Estimated Creatinine Clearance: 90.7 mL/min (by C-G formula based on SCr of 0.71 mg/dL). Liver Function Tests: No results for input(s): AST, ALT, ALKPHOS, BILITOT, PROT, ALBUMIN in the last 168 hours. No results for input(s): LIPASE, AMYLASE in the last 168 hours. No results for input(s): AMMONIA in the last 168 hours. Coagulation profile No results for input(s): INR, PROTIME in the last 168 hours.  CBC: Recent Labs  Lab 02/07/20 0110 02/08/20 0054 02/09/20 0227 02/11/20 0101 02/13/20 0048  WBC 20.6* 21.7* 24.2* 22.8* 18.5*  HGB 9.2* 9.6* 9.0* 9.0* 7.7*  HCT 28.8* 30.2* 28.0* 27.4* 23.9*  MCV 88.3 88.8 88.9 88.7 87.2  PLT 637* 660* 647* 679* 684*   Cardiac Enzymes: No results for input(s): CKTOTAL, CKMB, CKMBINDEX, TROPONINI in the last 168 hours. BNP (last 3 results) No results for input(s): PROBNP in the last 8760 hours. CBG: No results for input(s): GLUCAP in the last 168 hours. D-Dimer: No results for input(s): DDIMER in the last 72 hours. Hgb A1c: No results for input(s): HGBA1C in the last 72 hours. Lipid Profile: No results for input(s): CHOL, HDL, LDLCALC, TRIG, CHOLHDL, LDLDIRECT in the last 72 hours. Thyroid function studies: No results for input(s): TSH, T4TOTAL, T3FREE, THYROIDAB in the last 72 hours.  Invalid input(s): FREET3 Anemia work up: No results for input(s): VITAMINB12, FOLATE, FERRITIN, TIBC, IRON, RETICCTPCT in the last 72 hours. Sepsis Labs: Recent Labs  Lab 02/08/20 0054  02/09/20 0227 02/11/20 0101 02/13/20 0048  WBC 21.7* 24.2* 22.8* 18.5*    Microbiology Recent Results (from the past 240 hour(s))  Aerobic/Anaerobic Culture (surgical/deep wound)     Status: None   Collection Time: 02/04/20  4:32 PM   Specimen: PATH Other; Body Fluid  Result Value Ref Range Status   Specimen Description FLUID PLEURAL LEFT  Final   Special Requests  NONE  Final   Gram Stain   Final    MODERATE WBC PRESENT,BOTH PMN AND MONONUCLEAR ABUNDANT GRAM POSITIVE COCCI MODERATE GRAM NEGATIVE RODS    Culture   Final    RARE VIRIDANS STREPTOCOCCUS FEW PREVOTELLA LOESCHEII BETA LACTAMASE POSITIVE Performed at [facility], [address] [place], [place] [address]    Report Status 02/10/2020 FINAL  Final   Organism ID, Bacteria VIRIDANS STREPTOCOCCUS  Final      Susceptibility   Viridans streptococcus - MIC*    PENICILLIN INTERMEDIATE Intermediate     CEFTRIAXONE 2 INTERMEDIATE Intermediate     ERYTHROMYCIN <=0.12 SENSITIVE Sensitive     LEVOFLOXACIN 0.5 SENSITIVE Sensitive     VANCOMYCIN 0.25 SENSITIVE Sensitive     * RARE VIRIDANS STREPTOCOCCUS  Acid Fast Smear (AFB)     Status: None   Collection Time: 02/04/20  4:32 PM   Specimen: PATH Other; Body Fluid  Result Value Ref Range Status   AFB Specimen Processing Concentration  Final   Acid Fast Smear Negative  Final    Comment: (NOTE) Performed At: [facility] [address] [place], [place] [id number] Jolene SchimkeNagendra Sanjai MD XB:[id number]:[phone]    Source (AFB) FLUID  Final    Comment: LEFT PLEURAL Performed at [facility], [address] [place], [place] [address]   Aerobic/Anaerobic Culture (surgical/deep wound)     Status: None   Collection Time: 02/04/20  5:21 PM   Specimen: PATH Soft tissue resection  Result Value Ref Range Status   Specimen Description TISSUE PLEURAL  Final   Special Requests PLEURAL PEEL PT ON VANC  Final   Gram Stain   Final    FEW WBC PRESENT,BOTH PMN AND  MONONUCLEAR MODERATE GRAM POSITIVE COCCI FEW GRAM NEGATIVE RODS    Culture   Final    RARE VIRIDANS STREPTOCOCCUS SUSCEPTIBILITIES PERFORMED ON PREVIOUS CULTURE WITHIN THE LAST 5 DAYS. FEW PREVOTELLA LOESCHEII BETA LACTAMASE POSITIVE Performed at [facility], [address] [place], [place] [address]    Report Status 02/10/2020 FINAL  Final    Procedures and diagnostic studies:  DG CHEST PORT 1 VIEW  Result Date: 02/13/2020 CLINICAL DATA:  Follow-up empyema EXAM: PORTABLE CHEST 1 VIEW COMPARISON:  02/12/2020 FINDINGS: Left basilar chest tube remains in place. Ovoid density is again identified laterally consistent with the known history of empyema. No pneumothorax is noted. Minimal scarring is noted in the right lung. No bony abnormality is noted. IMPRESSION: Stable appearance of the chest when compared with the prior day. Electronically Signed   By: Alcide CleverMark  Lukens M.D.   On: 02/13/2020 08:54   DG CHEST PORT 1 VIEW  Result Date: 02/12/2020 CLINICAL DATA:  Empyema EXAM: PORTABLE CHEST 1 VIEW COMPARISON:  Chest x-rays dated 02/10/2020 and 02/09/2020 FINDINGS: One of the LEFT-sided chest tube has been removed. The LEFT basilar chest tube is stable in position. Persistent masslike opacity at the LEFT lung base, slightly decreased in size compared to the previous study, compatible with the given history of empyema. Coarse lung markings within the LEFT upper lobe is likely associated edema and/or atelectasis. RIGHT lung is unremarkable. No pneumothorax is seen. Heart size and mediastinal contours are stable. IMPRESSION: 1. One of the LEFT-sided chest tube has been removed. The LEFT basilar chest tube is stable in position. No pneumothorax. 2. Persistent masslike opacity at the LEFT lung base, slightly decreased in size compared to the most recent chest x-ray of 02/10/2020, compatible with the given  history of empyema. Electronically Signed   By: Bary Richard M.D.   On: 02/12/2020 08:44     Medications:   . Chlorhexidine Gluconate Cloth  6 each Topical Daily  . feeding supplement  237 mL Oral BID BM  . folic acid  1 mg Oral Daily  . guaiFENesin  600 mg Oral BID  . mouth rinse  15 mL Mouth Rinse BID  . metroNIDAZOLE  500 mg Oral Q8H  . morphine   Intravenous Q4H  . multivitamin with minerals  1 tablet Oral Daily  . nicotine  14 mg Transdermal Daily  . polyethylene glycol  17 g Oral Daily  . senna-docusate  1 tablet Oral QHS  . thiamine  100 mg Oral Daily   Continuous Infusions: . levofloxacin (LEVAQUIN) IV 750 mg (02/12/20 1447)     LOS: 11 days   Joseph Art  [facility]   How to contact the [facility] Attending or Consulting provider 7A - 7P or covering provider during after hours 7P -7A, for this patient?  1. Check the care team in [facility] and look for a) attending/consulting [facility] provider listed and b) the [facility] team listed 2. Log into [email or web address] and use [place]'s universal password to access. If you do not have the password, please contact the hospital operator. 3. Locate the [facility] provider you are looking for under [facility] and page to a number that you can be directly reached. 4. If you still have difficulty reaching the provider, please page the [facility] (Director on Call) for the Hospitalists listed on amion for assistance.  02/13/2020, 11:10 AM

## 2020-02-14 ENCOUNTER — Inpatient Hospital Stay (HOSPITAL_COMMUNITY): Payer: Medicaid - Out of State

## 2020-02-14 DIAGNOSIS — Z9889 Other specified postprocedural states: Secondary | ICD-10-CM

## 2020-02-14 DIAGNOSIS — J90 Pleural effusion, not elsewhere classified: Secondary | ICD-10-CM

## 2020-02-14 LAB — CBC
HCT: 25.8 % — ABNORMAL LOW (ref 39.0–52.0)
Hemoglobin: 8.6 g/dL — ABNORMAL LOW (ref 13.0–17.0)
MCH: 28.8 pg (ref 26.0–34.0)
MCHC: 33.3 g/dL (ref 30.0–36.0)
MCV: 86.3 fL (ref 80.0–100.0)
Platelets: 786 10*3/uL — ABNORMAL HIGH (ref 150–400)
RBC: 2.99 MIL/uL — ABNORMAL LOW (ref 4.22–5.81)
RDW: 14.6 % (ref 11.5–15.5)
WBC: 17.6 10*3/uL — ABNORMAL HIGH (ref 4.0–10.5)
nRBC: 0 % (ref 0.0–0.2)

## 2020-02-14 LAB — BASIC METABOLIC PANEL
Anion gap: 10 (ref 5–15)
BUN: 8 mg/dL (ref 8–23)
CO2: 24 mmol/L (ref 22–32)
Calcium: 8.1 mg/dL — ABNORMAL LOW (ref 8.9–10.3)
Chloride: 96 mmol/L — ABNORMAL LOW (ref 98–111)
Creatinine, Ser: 0.67 mg/dL (ref 0.61–1.24)
GFR, Estimated: >60 mL/min (ref >60)
Glucose, Bld: 97 mg/dL (ref 70–99)
Potassium: 4.1 mmol/L (ref 3.5–5.1)
Sodium: 130 mmol/L — ABNORMAL LOW (ref 135–145)

## 2020-02-14 LAB — NA AND K (SODIUM & POTASSIUM), RAND UR
Potassium Urine: 24 mmol/L
Sodium, Ur: 101 mmol/L

## 2020-02-14 NOTE — Progress Notes (Addendum)
      [address]       [place] [address]             [phone]      10 Days Post-Op Procedure(s) (LRB): LEFT THORACOTOMY, DRAINAGE OF EMPYEMA, CHEST TUBE INSERTION (Left) CHEST TUBE INSERTION (Left) Subjective: Says he feels good except for some continued soreness in his chest.  Left chest tube removed yesterday.  He is on room air.  Objective: Vital signs in last 24 hours: Temp:  [98.1 F (36.7 C)-99.5 F (37.5 C)] 98.1 F (36.7 C) (12/06 0725) Pulse Rate:  [61-173] 97 (12/06 0533) Cardiac Rhythm: Normal sinus rhythm (12/06 0701) Resp:  [16-28] 20 (12/06 0725) BP: (101-124)/(63-66) 103/66 (12/06 0725) SpO2:  [90 %-99 %] 90 % (12/06 0533)   Intake/Output from previous day: 12/05 0701 - 12/06 0700 In: -  Out: 640 [Urine:600; Chest Tube:40] Intake/Output this shift: No intake/output data recorded.  General appearance: alert, cooperative and no distress Neurologic: intact Heart: regular rate and rhythm Lungs: breath sounds clear, diminished left base. CXR stable post CT removal. Left basilar densities persist, no PTX.  Wound: The thoracotomy incision is approximated with skin staples, the CT site is covered with a dry dressing.   Lab Results: Recent Labs    02/13/20 0048 02/14/20 0059  WBC 18.5* 17.6*  HGB 7.7* 8.6*  HCT 23.9* 25.8*  PLT 684* 786*   BMET:  Recent Labs    02/14/20 0059  NA 130*  K 4.1  CL 96*  CO2 24  GLUCOSE 97  BUN 8  CREATININE 0.67  CALCIUM 8.1*    PT/INR: No results for input(s): LABPROT, INR in the last 72 hours. ABG    Component Value Date/Time   PHART 7.371 02/05/2020 0457   HCO3 20.5 02/05/2020 0457   TCO2 22 02/05/2020 0457   ACIDBASEDEF 5.0 (H) 02/05/2020 0457   O2SAT 95.0 02/05/2020 0457   CBG (last 3)  No results for input(s): GLUCAP in the last 72 hours.  Assessment/Plan: S/P Procedure(s) (LRB): LEFT THORACOTOMY, DRAINAGE OF EMPYEMA, CHEST TUBE INSERTION (Left) CHEST TUBE INSERTION  (Left)  -10 days post left thoracotomy for drainage of an empyema. CT removed yesterday. CXR stable. On IV levofloxin and oral flagyl for strept Viridans. WBC trending down.  Would leave CT site dressing in place for another 24 hours.  Mr, Kulikowski has f/u arranged for staple removal and to see Dr. Laneta Simmers.   -Anemia- Hct trending up.    LOS: 12 days    Parke Poisson [phone] 02/14/2020    Chart reviewed, patient examined, agree with above. Still has chest wall pain but overall feels better. CXR continues to improve. WBC decreasing. Antibiotics per medical service.

## 2020-02-14 NOTE — Progress Notes (Signed)
Dr. Benjamine Mola asked for Home evaluation for O2 needs. Pt has been on Room air for a couple of day.  He said that a couple of nights ago he needed it at night.  I asked him if we could walk and test his O2 demand. He said he wanted to do it later.  I have encouraged the Pt to come out and walk in the halls, and he has always said later

## 2020-02-14 NOTE — Plan of Care (Signed)
  Problem: Health Behavior/Discharge Planning: Goal: Ability to manage health-related needs will improve Outcome: Progressing   Problem: Clinical Measurements: Goal: Ability to maintain clinical measurements within normal limits will improve Outcome: Progressing Goal: Will remain free from infection Outcome: Progressing Goal: Diagnostic test results will improve Outcome: Progressing Goal: Respiratory complications will improve Outcome: Progressing Goal: Cardiovascular complication will be avoided Outcome: Progressing   Problem: Nutrition: Goal: Adequate nutrition will be maintained Outcome: Progressing   Problem: Pain Managment: Goal: General experience of comfort will improve Outcome: Progressing   Problem: Coping: Goal: Level of anxiety will decrease Outcome: Progressing   Problem: Safety: Goal: Ability to remain free from injury will improve Outcome: Progressing   Problem: Safety: Goal: Ability to remain free from injury will improve Outcome: Progressing   Problem: Skin Integrity: Goal: Risk for impaired skin integrity will decrease Outcome: Progressing

## 2020-02-14 NOTE — Plan of Care (Signed)
  Problem: Education: Goal: Knowledge of General Education information will improve Description: Including pain rating scale, medication(s)/side effects and non-pharmacologic comfort measures Outcome: Completed/Met   Problem: Activity: Goal: Risk for activity intolerance will decrease Outcome: Completed/Met   Problem: Elimination: Goal: Will not experience complications related to bowel motility Outcome: Completed/Met Goal: Will not experience complications related to urinary retention Outcome: Completed/Met

## 2020-02-14 NOTE — Progress Notes (Signed)
Progress Note    Francisco Pittman  QBH:[id number] DOB: 21-Oct-1960  DOA: 02/02/2020 PCP: Patient, No Pcp Per    Brief Narrative:    Medical records reviewed and are as summarized below:  Francisco Pittman is an 59 y.o. male h/o smoking, EtOH use who presents with non-resolving pain, cough, malaise after recent pneumonia. Patient admitted to APS last month for multifocal pneumonia, treated with IV antibiotics, and discharged with oral antibiotics. After completion of oral antibiotics, he continued to have severe left-sided pain, malaise, chills, and cough, and so he returned to the ER. On readmission, CT of the chest showed a large loculated pleural effusion.  S/p thoracotomy with drainage of empyema and lung decortication.  Had 2 chest tubes-- both have been removed.  PLan per CVTS.  Assessment/Plan:   Active Problems:   Multifocal pneumonia with Lt Sided Pleural Effusion   Normocytic anemia/iron deficiency noted   COPD and Tobacco abuse   Alcohol Abuse- Quit 11/2019   Poor dentition-Carries   COPD (chronic obstructive pulmonary disease) ---Smoker   Lt Empyema lung -loculated infected left pleural fluid consistent with empyema   S/P thoracotomy   Pressure injury of skin   Empyema: Strep Viridans, few Prevotella Loescheii S/p VATS 11/26 by Dr. Laneta Simmers Leukocytosis slowly decreasing - one chest tube removed on 12/3,  removal of last chest tube 12/5 per CVTS Levaquin/flagyl -ambulate -incentive spirometry -repeat x ray on 12/6: Mixed density along the LEFT chest wall with convex margins new from the previous exam following removal of LEFT-sided chest tube in the interval. This could be postprocedural in origin but given the interval change would correlate with any signs of infection in this area or with any pain, ultimately CT may be helpful for further assessment to exclude the possibility of chest wall involvement from ongoing infection. Hematoma is also considered. -plan  per CVTS  Smoking -continue to encourage cessation  Suspected COPD No acute exacerbation Will need formal PFT evaluation/diagnosis with PCP as outpatient  Continue inhalers   History of alcohol use -Continue thiamine and folic acid  Anemia of chronic disease -Hgb stable  hyponatremia -check urine studies  Hypokalemia -repleted  Pressure Injury 02/04/20 Buttocks Left Stage 1 -  Intact skin with non-blanchable redness of a localized area usually over a bony prominence. quarter sized non blanchable area (Active)  02/04/20 2000  Location: Buttocks  Location Orientation: Left  Staging: Stage 1 -  Intact skin with non-blanchable redness of a localized area usually over a bony prominence.  Wound Description (Comments): quarter sized non blanchable area  Present on Admission: Yes     Family Communication/Anticipated D/C date and plan/Code Status   DVT prophylaxis: scd- -lovenox when ok with CVTS Code Status: Full Code.  Disposition Plan: Status is: Inpatient  Remains inpatient appropriate because:Inpatient level of care appropriate due to severity of illness   Dispo: The patient is from: Home              Anticipated d/c is to: Home              Anticipated d/c date is: when ok with CVTS                      Medical Consultants:    CVTS    Subjective:   Coughing up sputum  Objective:    Vitals:   02/13/20 1620 02/14/20 0043 02/14/20 0533 02/14/20 0725  BP: 101/63 124/63 112/63 103/66  Pulse: 61 (!) 173  97   Resp: 19 (!) 28 (!) 23 20  Temp: 98.1 F (36.7 C) 98.8 F (37.1 C) 99.5 F (37.5 C) 98.1 F (36.7 C)  TempSrc: Oral Tympanic Oral Oral  SpO2: 99% 98% 90%   Weight:      Height:        Intake/Output Summary (Last 24 hours) at 02/14/2020 1037 Last data filed at 02/13/2020 1410 Gross per 24 hour  Intake --  Output 630 ml  Net -630 ml   Filed Weights   02/02/20 2231 02/05/20 0500 02/07/20 0528  Weight: 68.4 kg 69.5 kg 73 kg     Exam:  General: Appearance:     Overweight chronically ill appearing male in no acute distress   Poor dentition  Lungs:     Not on O2, respirations unlabored  Heart:    Normal heart rate. Normal rhythm. No murmurs, rubs, or gallops.   MS:   All extremities are intact.   Neurologic:   Awake, alert, oriented x 3. No apparent focal neurological           defect.      Data Reviewed:   I have personally reviewed following labs and imaging studies:  Labs: Labs show the following:   Basic Metabolic Panel: Recent Labs  Lab 02/08/20 0054 02/08/20 0054 02/09/20 0227 02/09/20 0227 02/11/20 0101 02/14/20 0059  NA 136  --  137  --  133* 130*  K 3.6   < > 3.8   < > 4.5 4.1  CL 101  --  101  --  98 96*  CO2 25  --  27  --  27 24  GLUCOSE 86  --  100*  --  95 97  BUN 12  --  10  --  9 8  CREATININE 0.60*  --  0.61  --  0.71 0.67  CALCIUM 7.7*  --  7.8*  --  8.0* 8.1*   < > = values in this interval not displayed.   GFR Estimated Creatinine Clearance: 90.7 mL/min (by C-G formula based on SCr of 0.67 mg/dL). Liver Function Tests: No results for input(s): AST, ALT, ALKPHOS, BILITOT, PROT, ALBUMIN in the last 168 hours. No results for input(s): LIPASE, AMYLASE in the last 168 hours. No results for input(s): AMMONIA in the last 168 hours. Coagulation profile No results for input(s): INR, PROTIME in the last 168 hours.  CBC: Recent Labs  Lab 02/08/20 0054 02/09/20 0227 02/11/20 0101 02/13/20 0048 02/14/20 0059  WBC 21.7* 24.2* 22.8* 18.5* 17.6*  HGB 9.6* 9.0* 9.0* 7.7* 8.6*  HCT 30.2* 28.0* 27.4* 23.9* 25.8*  MCV 88.8 88.9 88.7 87.2 86.3  PLT 660* 647* 679* 684* 786*   Cardiac Enzymes: No results for input(s): CKTOTAL, CKMB, CKMBINDEX, TROPONINI in the last 168 hours. BNP (last 3 results) No results for input(s): PROBNP in the last 8760 hours. CBG: No results for input(s): GLUCAP in the last 168 hours. D-Dimer: No results for input(s): DDIMER in the last 72  hours. Hgb A1c: No results for input(s): HGBA1C in the last 72 hours. Lipid Profile: No results for input(s): CHOL, HDL, LDLCALC, TRIG, CHOLHDL, LDLDIRECT in the last 72 hours. Thyroid function studies: No results for input(s): TSH, T4TOTAL, T3FREE, THYROIDAB in the last 72 hours.  Invalid input(s): FREET3 Anemia work up: No results for input(s): VITAMINB12, FOLATE, FERRITIN, TIBC, IRON, RETICCTPCT in the last 72 hours. Sepsis Labs: Recent Labs  Lab 02/09/20 0227 02/11/20 0101 02/13/20 0048 02/14/20 0059  WBC  24.2* 22.8* 18.5* 17.6*    Microbiology Recent Results (from the past 240 hour(s))  Aerobic/Anaerobic Culture (surgical/deep wound)     Status: None   Collection Time: 02/04/20  4:32 PM   Specimen: PATH Other; Body Fluid  Result Value Ref Range Status   Specimen Description FLUID PLEURAL LEFT  Final   Special Requests NONE  Final   Gram Stain   Final    MODERATE WBC PRESENT,BOTH PMN AND MONONUCLEAR ABUNDANT GRAM POSITIVE COCCI MODERATE GRAM NEGATIVE RODS    Culture   Final    RARE VIRIDANS STREPTOCOCCUS FEW PREVOTELLA LOESCHEII BETA LACTAMASE POSITIVE Performed at [facility], [address] [place], [place] [address]    Report Status 02/10/2020 FINAL  Final   Organism ID, Bacteria VIRIDANS STREPTOCOCCUS  Final      Susceptibility   Viridans streptococcus - MIC*    PENICILLIN INTERMEDIATE Intermediate     CEFTRIAXONE 2 INTERMEDIATE Intermediate     ERYTHROMYCIN <=0.12 SENSITIVE Sensitive     LEVOFLOXACIN 0.5 SENSITIVE Sensitive     VANCOMYCIN 0.25 SENSITIVE Sensitive     * RARE VIRIDANS STREPTOCOCCUS  Acid Fast Smear (AFB)     Status: None   Collection Time: 02/04/20  4:32 PM   Specimen: PATH Other; Body Fluid  Result Value Ref Range Status   AFB Specimen Processing Concentration  Final   Acid Fast Smear Negative  Final    Comment: (NOTE) Performed At: [facility] [address] [place], [place] [id number] Jolene Schimke MD  LF:[id number]    Source (AFB) FLUID  Final    Comment: LEFT PLEURAL Performed at [facility], [address] [place], [place] [address]   Aerobic/Anaerobic Culture (surgical/deep wound)     Status: None   Collection Time: 02/04/20  5:21 PM   Specimen: PATH Soft tissue resection  Result Value Ref Range Status   Specimen Description TISSUE PLEURAL  Final   Special Requests PLEURAL PEEL PT ON VANC  Final   Gram Stain   Final    FEW WBC PRESENT,BOTH PMN AND MONONUCLEAR MODERATE GRAM POSITIVE COCCI FEW GRAM NEGATIVE RODS    Culture   Final    RARE VIRIDANS STREPTOCOCCUS SUSCEPTIBILITIES PERFORMED ON PREVIOUS CULTURE WITHIN THE LAST 5 DAYS. FEW PREVOTELLA LOESCHEII BETA LACTAMASE POSITIVE Performed at [facility], [address], [place], [place] [address]    Report Status 02/10/2020 FINAL  Final    Procedures and diagnostic studies:  DG Chest 2 View  Result Date: 02/14/2020 CLINICAL DATA:  Empyema follow-up evaluation EXAM: CHEST - 2 VIEW COMPARISON:  Multiple prior studies most recent February 13, 2020 FINDINGS: Postoperative changes are noted along the LEFT axilla with skin staples in place. Along the LEFT chest wall there is mixed gas in soft tissue density at the site of previous chest tube and above this area extending beneath the surgical changes and into the LEFT axilla. Large ovoid area representing consolidation and/or loculated pleural fluid with similar appearance at the peripheral LEFT chest. Multifocal patchy opacities in the LEFT chest also without change. Trachea midline. Cardiomediastinal contours are stable, partially obscured along with a portion of the LEFT hemidiaphragm in the LEFT chest. Pleural thickening and or fluid tracking over the LEFT apex. The no pneumothorax. Linear opacity in the RIGHT mid chest at the site of prior pneumonia.w On limited assessment no acute skeletal process. IMPRESSION: 1. Mixed density along the LEFT chest wall with convex  margins new from the previous  exam following removal of LEFT-sided chest tube in the interval. This could be postprocedural in origin but given the interval change would correlate with any signs of infection in this area or with any pain, ultimately CT may be helpful for further assessment to exclude the possibility of chest wall involvement from ongoing infection. Hematoma is also considered. 2. Persistent ovoid area of consolidation and presumed loculated pleural fluid in the peripheral LEFT chest. 3. Multifocal patchy opacities in the LEFT chest also unchanged. 4. No visible pneumothorax. These results will be called to the ordering clinician or representative by the Radiologist Assistant, and communication documented in the PACS or [employer]. Electronically Signed   By: Donzetta Kohut M.D.   On: 02/14/2020 08:16   DG CHEST PORT 1 VIEW  Result Date: 02/13/2020 CLINICAL DATA:  Follow-up empyema EXAM: PORTABLE CHEST 1 VIEW COMPARISON:  02/12/2020 FINDINGS: Left basilar chest tube remains in place. Ovoid density is again identified laterally consistent with the known history of empyema. No pneumothorax is noted. Minimal scarring is noted in the right lung. No bony abnormality is noted. IMPRESSION: Stable appearance of the chest when compared with the prior day. Electronically Signed   By: Alcide Clever M.D.   On: 02/13/2020 08:54    Medications:   . Chlorhexidine Gluconate Cloth  6 each Topical Daily  . feeding supplement  237 mL Oral BID BM  . folic acid  1 mg Oral Daily  . guaiFENesin  600 mg Oral BID  . mouth rinse  15 mL Mouth Rinse BID  . metroNIDAZOLE  500 mg Oral Q8H  . multivitamin with minerals  1 tablet Oral Daily  . nicotine  14 mg Transdermal Daily  . polyethylene glycol  17 g Oral Daily  . senna-docusate  1 tablet Oral QHS  . thiamine  100 mg Oral Daily   Continuous Infusions: . levofloxacin (LEVAQUIN) IV 750 mg (02/14/20 1020)     LOS: 12 days   Joseph Art  [facility]   How to contact the [facility] Attending or Consulting provider 7A - 7P or covering provider during after hours 7P -7A, for this patient?  1. Check the care team in [facility] and look for a) attending/consulting TRH provider listed and b) the [facility] team listed 2. Log into [email or web address] and use [place]'s universal password to access. If you do not have the password, please contact the hospital operator. 3. Locate the [facility] provider you are looking for under [facility] and page to a number that you can be directly reached. 4. If you still have difficulty reaching the provider, please page the [facility] (Director on Call) for the Hospitalists listed on amion for assistance.  02/14/2020, 10:37 AM

## 2020-02-14 NOTE — Progress Notes (Signed)
PCA stopped approximately 1944 because chest tube was removed earlier in the day. PCA syringe was emptied did not refill, I transitioned patient to his oral pain meds and is tolerating well. Pain mostly with a forceful cough when he occasionally will gets.

## 2020-02-15 ENCOUNTER — Inpatient Hospital Stay (HOSPITAL_COMMUNITY): Payer: Medicaid - Out of State

## 2020-02-15 LAB — OSMOLALITY: Osmolality: 281 mOsm/kg (ref 275–295)

## 2020-02-15 LAB — CBC
HCT: 27.9 % — ABNORMAL LOW (ref 39.0–52.0)
Hemoglobin: 8.9 g/dL — ABNORMAL LOW (ref 13.0–17.0)
MCH: 28.4 pg (ref 26.0–34.0)
MCHC: 31.9 g/dL (ref 30.0–36.0)
MCV: 89.1 fL (ref 80.0–100.0)
Platelets: 884 10*3/uL — ABNORMAL HIGH (ref 150–400)
RBC: 3.13 MIL/uL — ABNORMAL LOW (ref 4.22–5.81)
RDW: 14.6 % (ref 11.5–15.5)
WBC: 17.6 10*3/uL — ABNORMAL HIGH (ref 4.0–10.5)
nRBC: 0 % (ref 0.0–0.2)

## 2020-02-15 LAB — OSMOLALITY, URINE: Osmolality, Ur: 519 mOsm/kg (ref 300–900)

## 2020-02-15 LAB — BASIC METABOLIC PANEL
Anion gap: 7 (ref 5–15)
BUN: 9 mg/dL (ref 8–23)
CO2: 26 mmol/L (ref 22–32)
Calcium: 8 mg/dL — ABNORMAL LOW (ref 8.9–10.3)
Chloride: 96 mmol/L — ABNORMAL LOW (ref 98–111)
Creatinine, Ser: 0.66 mg/dL (ref 0.61–1.24)
GFR, Estimated: >60 mL/min (ref >60)
Glucose, Bld: 103 mg/dL — ABNORMAL HIGH (ref 70–99)
Potassium: 4.1 mmol/L (ref 3.5–5.1)
Sodium: 129 mmol/L — ABNORMAL LOW (ref 135–145)

## 2020-02-15 MED ORDER — LEVOFLOXACIN 750 MG PO TABS
750.0000 mg | ORAL_TABLET | Freq: Every day | ORAL | Status: DC
  Administered 2020-02-15 – 2020-02-16 (×2): 750 mg via ORAL
  Filled 2020-02-15 (×2): qty 1

## 2020-02-15 NOTE — Progress Notes (Signed)
Progress Note    Francisco Pittman  UYQ:[id number] DOB: [birth year]  DOA: 02/02/2020 PCP: Patient, No Pcp Per    Brief Narrative:    Medical records reviewed and are as summarized below:  Francisco Pittman is an 59 y.o. male h/o smoking, EtOH use who presents with non-resolving pain, cough, malaise after recent pneumonia. Patient admitted to APS last month for multifocal pneumonia, treated with IV antibiotics, and discharged with oral antibiotics. After completion of oral antibiotics, he continued to have severe left-sided pain, malaise, chills, and cough, and so he returned to the ER. On readmission, CT of the chest showed a large loculated pleural effusion.  S/p thoracotomy with drainage of empyema and lung decortication.  Had 2 chest tubes-- both have been removed.  Change abx to PO.  Stay is currently being complicated by hyponatremia.  Can d/c home once Na stable.  Assessment/Plan:   Active Problems:   Multifocal pneumonia with Lt Sided Pleural Effusion   Normocytic anemia/iron deficiency noted   COPD and Tobacco abuse   Alcohol Abuse- Quit 11/2019   Poor dentition-Carries   COPD (chronic obstructive pulmonary disease) ---Smoker   Lt Empyema lung -loculated infected left pleural fluid consistent with empyema   S/P thoracotomy   Pressure injury of skin   Empyema: Strep Viridans, few Prevotella Loescheii S/p VATS 11/26 by Dr. Laneta Simmers Leukocytosis slowly decreasing -chest tube removed on 12/3,  removal of last chest tube 12/5 per CVTS Levaquin/flagyl- change to PO -ambulate -incentive spirometry  Hyponatremia -check osmos and urine Na -recheck in AM -not drinking excessive amounts of water -no on HCTZ  Smoking -continue to encourage cessation  Suspected COPD No acute exacerbation Will need formal PFT evaluation/diagnosis with PCP as outpatient  Continue inhalers   History of alcohol use -Continue thiamine and folic acid  Anemia of chronic disease -Hgb  stable  Hypokalemia -repleted  Pressure Injury 02/04/20 Buttocks Left Stage 1 -  Intact skin with non-blanchable redness of a localized area usually over a bony prominence. quarter sized non blanchable area (Active)  02/04/20 2000  Location: Buttocks  Location Orientation: Left  Staging: Stage 1 -  Intact skin with non-blanchable redness of a localized area usually over a bony prominence.  Wound Description (Comments): quarter sized non blanchable area  Present on Admission: Yes     Family Communication/Anticipated D/C date and plan/Code Status   DVT prophylaxis: scd- -lovenox when ok with CVTS Code Status: Full Code.  Disposition Plan: Status is: Inpatient  Remains inpatient appropriate because:Inpatient level of care appropriate due to severity of illness   Dispo: The patient is from: Home              Anticipated d/c is to: Home              Anticipated d/c date is: when Na stable                      Medical Consultants:    CVTS    Subjective:   No current complaints  Objective:    Vitals:   02/14/20 1942 02/14/20 2348 02/15/20 0428 02/15/20 0858  BP: 124/68 114/61 (!) 104/56 101/61  Pulse: 85 79 67 90  Resp: 20 18 16    Temp: 98.9 F (37.2 C) 98.6 F (37 C) 98.9 F (37.2 C) 98.8 F (37.1 C)  TempSrc: Oral Oral Oral Oral  SpO2: 92% 90% 90% 92%  Weight:      Height:  Intake/Output Summary (Last 24 hours) at 02/15/2020 1006 Last data filed at 02/14/2020 1900 Gross per 24 hour  Intake 100 ml  Output 300 ml  Net -200 ml   Filed Weights   02/02/20 2231 02/05/20 0500 02/07/20 0528  Weight: 68.4 kg 69.5 kg 73 kg    Exam:   General: Appearance:     Overweight male in no acute distress     Lungs:     Not on O2, respirations unlabored  Heart:    Normal heart rate. Normal rhythm. No murmurs, rubs, or gallops.   MS:   All extremities are intact.   Neurologic:   Awake, alert, oriented x 3. No apparent focal neurological           defect.                          Data Reviewed:   I have personally reviewed following labs and imaging studies:  Labs: Labs show the following:   Basic Metabolic Panel: Recent Labs  Lab 02/09/20 0227 02/09/20 0227 02/11/20 0101 02/11/20 0101 02/14/20 0059 02/15/20 0031  NA 137  --  133*  --  130* 129*  K 3.8   < > 4.5   < > 4.1 4.1  CL 101  --  98  --  96* 96*  CO2 27  --  27  --  24 26  GLUCOSE 100*  --  95  --  97 103*  BUN 10  --  9  --  8 9  CREATININE 0.61  --  0.71  --  0.67 0.66  CALCIUM 7.8*  --  8.0*  --  8.1* 8.0*   < > = values in this interval not displayed.   GFR Estimated Creatinine Clearance: 90.7 mL/min (by C-G formula based on SCr of 0.66 mg/dL). Liver Function Tests: No results for input(s): AST, ALT, ALKPHOS, BILITOT, PROT, ALBUMIN in the last 168 hours. No results for input(s): LIPASE, AMYLASE in the last 168 hours. No results for input(s): AMMONIA in the last 168 hours. Coagulation profile No results for input(s): INR, PROTIME in the last 168 hours.  CBC: Recent Labs  Lab 02/09/20 0227 02/11/20 0101 02/13/20 0048 02/14/20 0059  WBC 24.2* 22.8* 18.5* 17.6*  HGB 9.0* 9.0* 7.7* 8.6*  HCT 28.0* 27.4* 23.9* 25.8*  MCV 88.9 88.7 87.2 86.3  PLT 647* 679* 684* 786*   Cardiac Enzymes: No results for input(s): CKTOTAL, CKMB, CKMBINDEX, TROPONINI in the last 168 hours. BNP (last 3 results) No results for input(s): PROBNP in the last 8760 hours. CBG: No results for input(s): GLUCAP in the last 168 hours. D-Dimer: No results for input(s): DDIMER in the last 72 hours. Hgb A1c: No results for input(s): HGBA1C in the last 72 hours. Lipid Profile: No results for input(s): CHOL, HDL, LDLCALC, TRIG, CHOLHDL, LDLDIRECT in the last 72 hours. Thyroid function studies: No results for input(s): TSH, T4TOTAL, T3FREE, THYROIDAB in the last 72 hours.  Invalid input(s): FREET3 Anemia work up: No results for input(s): VITAMINB12, FOLATE, FERRITIN, TIBC,  IRON, RETICCTPCT in the last 72 hours. Sepsis Labs: Recent Labs  Lab 02/09/20 0227 02/11/20 0101 02/13/20 0048 02/14/20 0059  WBC 24.2* 22.8* 18.5* 17.6*    Microbiology No results found for this or any previous visit (from the past 240 hour(s)).  Procedures and diagnostic studies:  DG Chest 2 View  Result Date: 02/14/2020 CLINICAL DATA:  Empyema follow-up evaluation EXAM: CHEST -  2 VIEW COMPARISON:  Multiple prior studies most recent February 13, 2020 FINDINGS: Postoperative changes are noted along the LEFT axilla with skin staples in place. Along the LEFT chest wall there is mixed gas in soft tissue density at the site of previous chest tube and above this area extending beneath the surgical changes and into the LEFT axilla. Large ovoid area representing consolidation and/or loculated pleural fluid with similar appearance at the peripheral LEFT chest. Multifocal patchy opacities in the LEFT chest also without change. Trachea midline. Cardiomediastinal contours are stable, partially obscured along with a portion of the LEFT hemidiaphragm in the LEFT chest. Pleural thickening and or fluid tracking over the LEFT apex. The no pneumothorax. Linear opacity in the RIGHT mid chest at the site of prior pneumonia.w On limited assessment no acute skeletal process. IMPRESSION: 1. Mixed density along the LEFT chest wall with convex margins new from the previous exam following removal of LEFT-sided chest tube in the interval. This could be postprocedural in origin but given the interval change would correlate with any signs of infection in this area or with any pain, ultimately CT may be helpful for further assessment to exclude the possibility of chest wall involvement from ongoing infection. Hematoma is also considered. 2. Persistent ovoid area of consolidation and presumed loculated pleural fluid in the peripheral LEFT chest. 3. Multifocal patchy opacities in the LEFT chest also unchanged. 4. No visible  pneumothorax. These results will be called to the ordering clinician or representative by the Radiologist Assistant, and communication documented in the PACS or [employer]. Electronically Signed   By: Donzetta Kohut M.D.   On: 02/14/2020 08:16   DG CHEST PORT 1 VIEW  Result Date: 02/15/2020 CLINICAL DATA:  Chest pain. EXAM: PORTABLE CHEST 1 VIEW COMPARISON:  02/14/2020.  CT 02/02/2020. FINDINGS: Mediastinum and hilar structures normal. Heart size normal. Heart size stable. Persistent infiltrate and left-sided pleural thickening left mid lung. Persistent density right mid lung. No pneumothorax. Surgical clips noted the left chest. At the level of the surgical clips soft tissue swelling and soft tissue air again noted. Clinical correlation suggested. Again this could be related to prior chest tube removal however soft tissue infection cannot be excluded. IMPRESSION: 1. Persistent infiltrate and left-sided pleural thickening left mid lung. Persistent density right mid lung. No pneumothorax. 2. Surgical clips left chest. Soft tissue swelling and soft tissue air again noted at the level of surgical clips. Again this could be related to prior chest tube removal however soft tissue infection cannot be excluded. Electronically Signed   By: Maisie Fus  Register   On: 02/15/2020 08:27    Medications:   . Chlorhexidine Gluconate Cloth  6 each Topical Daily  . feeding supplement  237 mL Oral BID BM  . folic acid  1 mg Oral Daily  . guaiFENesin  600 mg Oral BID  . levofloxacin  750 mg Oral Daily  . mouth rinse  15 mL Mouth Rinse BID  . metroNIDAZOLE  500 mg Oral Q8H  . multivitamin with minerals  1 tablet Oral Daily  . nicotine  14 mg Transdermal Daily  . polyethylene glycol  17 g Oral Daily  . senna-docusate  1 tablet Oral QHS  . thiamine  100 mg Oral Daily   Continuous Infusions:    LOS: 13 days   Joseph Art  [facility]   How to contact the [facility] Attending or Consulting provider 7A -  7P or covering provider during after hours 7P -7A,  for this patient?  1. Check the care team in [facility] and look for a) attending/consulting TRH provider listed and b) the [facility] team listed 2. Log into [email or web address] and use [place]'s universal password to access. If you do not have the password, please contact the hospital operator. 3. Locate the [facility] provider you are looking for under [facility] and page to a number that you can be directly reached. 4. If you still have difficulty reaching the provider, please page the [facility] (Director on Call) for the Hospitalists listed on amion for assistance.  02/15/2020, 10:06 AM

## 2020-02-15 NOTE — Progress Notes (Addendum)
      [address]       [place] [id number]             [phone]      11 Days Post-Op Procedure(s) (LRB): LEFT THORACOTOMY, DRAINAGE OF EMPYEMA, CHEST TUBE INSERTION (Left) CHEST TUBE INSERTION (Left) Subjective: No new concerns, denies any change in his breathing. He says he is using the incentive spirometer regularly.   He remains on room air.  Objective: Vital signs in last 24 hours: Temp:  [98.4 F (36.9 C)-98.9 F (37.2 C)] 98.9 F (37.2 C) (12/07 0428) Pulse Rate:  [67-85] 67 (12/07 0428) Resp:  [16-20] 16 (12/07 0428) BP: (98-124)/(56-68) 104/56 (12/07 0428) SpO2:  [90 %-92 %] 90 % (12/07 0428)   Intake/Output from previous day: 12/06 0701 - 12/07 0700 In: 100 [IV Piggyback:100] Out: 300 [Urine:300] Intake/Output this shift: No intake/output data recorded.  General appearance: alert, cooperative and no distress Neurologic: intact Heart: regular rate and rhythm Lungs: breath sounds clear, diminished left base. CXR stable post CT removal. Left basilar densities persist, no PTX. Small pocket of subQ air at the incision is unchanged. Wound: The thoracotomy incision is approximated with skin staples, the CT site is covered with a dry dressing.   Lab Results: Recent Labs    02/13/20 0048 02/14/20 0059  WBC 18.5* 17.6*  HGB 7.7* 8.6*  HCT 23.9* 25.8*  PLT 684* 786*   BMET:  Recent Labs    02/14/20 0059 02/15/20 0031  NA 130* 129*  K 4.1 4.1  CL 96* 96*  CO2 24 26  GLUCOSE 97 103*  BUN 8 9  CREATININE 0.67 0.66  CALCIUM 8.1* 8.0*    PT/INR: No results for input(s): LABPROT, INR in the last 72 hours. ABG    Component Value Date/Time   PHART 7.371 02/05/2020 0457   HCO3 20.5 02/05/2020 0457   TCO2 22 02/05/2020 0457   ACIDBASEDEF 5.0 (H) 02/05/2020 0457   O2SAT 95.0 02/05/2020 0457   CBG (last 3)  No results for input(s): GLUCAP in the last 72 hours.  Assessment/Plan: S/P Procedure(s) (LRB): LEFT THORACOTOMY, DRAINAGE OF  EMPYEMA, CHEST TUBE INSERTION (Left) CHEST TUBE INSERTION (Left)  -11 days post left thoracotomy for drainage of an empyema. CT removed 12/5 CXR stable. On IV levofloxin and oral flagyl per primary team.  - OK to discharge from CTS standpoint on oral ABX,  Mr. Dollard has f/u arranged for staple removal and to see Dr. Laneta Simmers.   LOS: 13 days    Parke Poisson [phone] 02/15/2020    Chart reviewed, patient examined, agree with above.

## 2020-02-16 DIAGNOSIS — L89321 Pressure ulcer of left buttock, stage 1: Secondary | ICD-10-CM

## 2020-02-16 LAB — CBC
HCT: 25.2 % — ABNORMAL LOW (ref 39.0–52.0)
Hemoglobin: 8 g/dL — ABNORMAL LOW (ref 13.0–17.0)
MCH: 28.1 pg (ref 26.0–34.0)
MCHC: 31.7 g/dL (ref 30.0–36.0)
MCV: 88.4 fL (ref 80.0–100.0)
Platelets: 849 10*3/uL — ABNORMAL HIGH (ref 150–400)
RBC: 2.85 MIL/uL — ABNORMAL LOW (ref 4.22–5.81)
RDW: 14.6 % (ref 11.5–15.5)
WBC: 18.8 10*3/uL — ABNORMAL HIGH (ref 4.0–10.5)
nRBC: 0 % (ref 0.0–0.2)

## 2020-02-16 LAB — BASIC METABOLIC PANEL
Anion gap: 8 (ref 5–15)
BUN: 10 mg/dL (ref 8–23)
CO2: 25 mmol/L (ref 22–32)
Calcium: 8 mg/dL — ABNORMAL LOW (ref 8.9–10.3)
Chloride: 97 mmol/L — ABNORMAL LOW (ref 98–111)
Creatinine, Ser: 0.74 mg/dL (ref 0.61–1.24)
GFR, Estimated: >60 mL/min (ref >60)
Glucose, Bld: 106 mg/dL — ABNORMAL HIGH (ref 70–99)
Potassium: 4.3 mmol/L (ref 3.5–5.1)
Sodium: 130 mmol/L — ABNORMAL LOW (ref 135–145)

## 2020-02-16 MED ORDER — METRONIDAZOLE 500 MG PO TABS: 500.0000 mg | ORAL_TABLET | Freq: Three times a day (TID) | ORAL | 0 refills | Status: AC

## 2020-02-16 MED ORDER — LEVOFLOXACIN 750 MG PO TABS: 750.0000 mg | ORAL_TABLET | Freq: Every day | ORAL | 0 refills | Status: AC

## 2020-02-16 MED ORDER — OXYCODONE HCL 10 MG PO TABS: 10.0000 mg | ORAL_TABLET | ORAL | 0 refills | Status: AC | PRN

## 2020-02-16 NOTE — Plan of Care (Signed)
  Problem: Health Behavior/Discharge Planning: Goal: Ability to manage health-related needs will improve Outcome: Adequate for Discharge   Problem: Clinical Measurements: Goal: Ability to maintain clinical measurements within normal limits will improve Outcome: Adequate for Discharge Goal: Will remain free from infection Outcome: Adequate for Discharge Goal: Diagnostic test results will improve Outcome: Adequate for Discharge Goal: Respiratory complications will improve Outcome: Adequate for Discharge Goal: Cardiovascular complication will be avoided Outcome: Adequate for Discharge   Problem: Nutrition: Goal: Adequate nutrition will be maintained Outcome: Adequate for Discharge   Problem: Coping: Goal: Level of anxiety will decrease Outcome: Adequate for Discharge   Problem: Pain Managment: Goal: General experience of comfort will improve Outcome: Adequate for Discharge   Problem: Safety: Goal: Ability to remain free from injury will improve Outcome: Adequate for Discharge   Problem: Skin Integrity: Goal: Risk for impaired skin integrity will decrease Outcome: Adequate for Discharge

## 2020-02-16 NOTE — Discharge Summary (Addendum)
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 wishes to proceed with the procedure. Written consent was obtained. Ultrasound was performed to localize and mark an adequate pocket of fluid in the LEFT chest. Observed LEFT pleural fluid collection is markedly complex, containing scattered echogenic foci question air, complex heterogeneous fluid, and markedly loculated in appearance. The area was then prepped and draped in the normal sterile fashion. 1% Lidocaine was used for local anesthesia. Under ultrasound guidance a 8 French thoracentesis catheter was introduced. Thoracentesis was performed. The catheter was removed and a dressing applied. FINDINGS: A total of approximately 180 mL of grossly purulent and foul smelling fluid was removed. Samples were sent to the laboratory as requested by  the clinical team. IMPRESSION: Successful ultrasound guided diagnostic LEFT thoracentesis yielding 180 mL of pleural fluid as above. Sonographic findings in appearance of the fluid are consistent with LEFT empyema. Electronically Signed   By: Ulyses Southward M.D.   On: 02/02/2020 18:07     Subjective:   Discharge Exam: Vitals:   02/16/20 0900 02/16/20 1147  BP: (!) 105/59 111/63  Pulse: 81 81   Resp: 18 16  Temp: 98.2 F (36.8 C) 98.7 F (37.1 C)  SpO2: 92% 92%   Vitals:   02/15/20 2317 02/16/20 0356 02/16/20 0900 02/16/20 1147  BP: 115/60 117/60 (!) 105/59 111/63  Pulse: 93 88 81 81  Resp: 20 18 18 16   Temp: 99.8 F (37.7 C) 98.9 F (37.2 C) 98.2 F (36.8 C) 98.7 F (37.1 C)  TempSrc: Oral Oral Oral Oral  SpO2: 90% 92% 92% 92%  Weight:      Height:        General: Pt is alert, awake, not in acute distress Cardiovascular: RRR, S1/S2 +, no rubs, no gallops Respiratory: CTA bilaterally, no wheezing, no rhonchi Abdominal: Soft, NT, ND, bowel sounds + Extremities: no edema, no cyanosis Integumentary: Left-sided wound noted with staples in place, well approximated, chest tube site with dressing and sutures in place.    The results of significant diagnostics from this hospitalization (including imaging, microbiology, ancillary and laboratory) are listed below for reference.     Microbiology: No results found for this or any previous visit (from the past 240 hour(s)).   Labs: BNP (last 3 results) No results for input(s): BNP in the last 8760 hours. Basic Metabolic Panel: Recent Labs  Lab 02/11/20 0101 02/14/20 0059 02/15/20 0031 02/16/20 0013  NA 133* 130* 129* 130*  K 4.5 4.1 4.1 4.3  CL 98 96* 96* 97*  CO2 27 24 26 25   GLUCOSE 95 97 103* 106*  BUN 9 8 9 10   CREATININE 0.71 0.67 0.66 0.74  CALCIUM 8.0* 8.1* 8.0* 8.0*   Liver Function Tests: No results for input(s): AST, ALT, ALKPHOS, BILITOT, PROT, ALBUMIN in the last 168 hours. No results for input(s): LIPASE, AMYLASE in the last 168 hours. No results for input(s): AMMONIA in the last 168 hours. CBC: Recent Labs  Lab 02/11/20 0101 02/13/20 0048 02/14/20 0059 02/15/20 0913 02/16/20 0013  WBC 22.8* 18.5* 17.6* 17.6* 18.8*  HGB 9.0* 7.7* 8.6* 8.9* 8.0*  HCT 27.4* 23.9* 25.8* 27.9* 25.2*  MCV 88.7 87.2 86.3 89.1 88.4  PLT 679* 684* 786* 884* 849*   Cardiac Enzymes: No results for input(s):  CKTOTAL, CKMB, CKMBINDEX, TROPONINI in the last 168 hours. BNP: Invalid input(s): POCBNP CBG: No results for input(s): GLUCAP in the last 168 hours. D-Dimer No results for input(s): DDIMER in the last 72 hours. Hgb A1c No results for input(s): HGBA1C in the last 72 hours. Lipid Profile No results for input(s): CHOL, HDL, LDLCALC, TRIG, CHOLHDL, LDLDIRECT in the last 72 hours. Thyroid function studies No results for input(s): TSH, T4TOTAL, T3FREE, THYROIDAB in the last 72 hours.  Invalid input(s): FREET3 Anemia work up No results for input(s): VITAMINB12, FOLATE, FERRITIN, TIBC, IRON, RETICCTPCT in the last 72 hours. Urinalysis    Component Value Date/Time   COLORURINE YELLOW 02/02/2020 1640   APPEARANCEUR HAZY (A) 02/02/2020 1640   LABSPEC 1.020 02/02/2020 1640   PHURINE 5.0 02/02/2020 1640   GLUCOSEU NEGATIVE 02/02/2020 1640   HGBUR NEGATIVE 02/02/2020 1640   BILIRUBINUR NEGATIVE 02/02/2020 1640   KETONESUR 5 (A) 02/02/2020 1640  PROTEINUR 30 (A) 02/02/2020 1640   NITRITE NEGATIVE 02/02/2020 1640   LEUKOCYTESUR NEGATIVE 02/02/2020 1640   Sepsis Labs Invalid input(s): PROCALCITONIN,  WBC,  LACTICIDVEN Microbiology No results found for this or any previous visit (from the past 240 hour(s)).   Time coordinating discharge: Over 30 minutes  SIGNED:   Alvira Philips [place], DO  [facility] 02/16/2020, 12:19 PM

## 2020-02-16 NOTE — Progress Notes (Signed)
Pts wife provided with d/c instructions as well. Wife was concerned she would not make it to pharmacy in time. This RN called [facility] and pharmacy does not close until 9pm. Pt left at 1630. Educated wife on pharmacy hours and the medications that are being picked up as they are important in pts care to have these medication. She verbalized understanding and reports she will pick them up on the way home.

## 2020-02-16 NOTE — Progress Notes (Signed)
D/c eduction given to pt. Pts medication reviewed and he has no questions at this time. Medications have been sent into pts pharmacy. Son will be picking pt up and taking him to private residence. Both incisions are open to air, no draining noted. follow up appt for suture and staple removal provided to pt on d/c paper work.  No other needs at this time.

## 2020-02-25 ENCOUNTER — Other Ambulatory Visit: Payer: Self-pay

## 2020-02-25 ENCOUNTER — Ambulatory Visit
Admission: RE | Admit: 2020-02-25 | Discharge: 2020-02-25 | Disposition: A | Discharge status: Home / Self Care | Payer: Medicaid - Out of State | Source: Ambulatory Visit | Attending: Surgery | Admitting: Surgery

## 2020-02-25 ENCOUNTER — Ambulatory Visit (INDEPENDENT_AMBULATORY_CARE_PROVIDER_SITE_OTHER): Payer: Self-pay | Admitting: *Deleted

## 2020-02-25 VITALS — BP 103/69 | HR 65

## 2020-02-25 DIAGNOSIS — R0602 Shortness of breath: Secondary | ICD-10-CM

## 2020-02-25 DIAGNOSIS — Z4802 Encounter for removal of sutures: Secondary | ICD-10-CM

## 2020-02-25 NOTE — Progress Notes (Signed)
Patient arrived for nurse visit to remove sutures and staples post-Thoractomy 02/04/20 by Dr. Laneta Simmers. Sutures and staples removed with no signs or symptoms of infection noted.  Incisions well approximated.  Patient tolerated suture and staple removal well.  Patient and family instructed to keep the incision site clean and dry.  Gauze placed over incisions r/t wife stating they had multiple animals at home.  During visit pt's greatest complaint was being SOB most of the time. O2 sats stayed around 96-97% during visit.  CXR placed for further eval. Instructed pt, will call with results once completed.

## 2020-03-01 ENCOUNTER — Ambulatory Visit: Payer: Self-pay | Admitting: Surgery

## 2020-03-02 LAB — FUNGAL ORGANISM REFLEX

## 2020-03-02 LAB — FUNGUS CULTURE WITH STAIN

## 2020-03-02 LAB — FUNGUS CULTURE RESULT

## 2020-03-09 ENCOUNTER — Other Ambulatory Visit: Payer: Self-pay | Admitting: Surgery

## 2020-03-09 ENCOUNTER — Telehealth: Payer: Self-pay | Admitting: *Deleted

## 2020-03-09 DIAGNOSIS — Z9889 Other specified postprocedural states: Secondary | ICD-10-CM

## 2020-03-09 DIAGNOSIS — J869 Pyothorax without fistula: Secondary | ICD-10-CM

## 2020-03-09 NOTE — Telephone Encounter (Signed)
Attempted to return phone call from Mr. Francisco Pittman regarding pain management and rescheduling of his f/u appt with Dr. Laneta Simmers. Left voice message for pt to return call.

## 2020-03-13 ENCOUNTER — Ambulatory Visit: Payer: Self-pay | Admitting: Surgery

## 2020-03-21 ENCOUNTER — Other Ambulatory Visit: Payer: Self-pay | Admitting: *Deleted

## 2020-03-21 DIAGNOSIS — J869 Pyothorax without fistula: Secondary | ICD-10-CM

## 2020-03-21 LAB — ACID FAST CULTURE WITH REFLEXED SENSITIVITIES (MYCOBACTERIA): Acid Fast Culture: NEGATIVE

## 2020-03-22 ENCOUNTER — Ambulatory Visit
Admission: RE | Admit: 2020-03-22 | Discharge: 2020-03-22 | Disposition: A | Discharge status: Home / Self Care | Payer: Medicaid - Out of State | Source: Ambulatory Visit | Attending: Surgery | Admitting: Surgery

## 2020-03-22 ENCOUNTER — Ambulatory Visit: Payer: Self-pay | Admitting: Surgery

## 2020-03-22 DIAGNOSIS — J869 Pyothorax without fistula: Secondary | ICD-10-CM

## 2020-03-23 ENCOUNTER — Telehealth: Payer: Self-pay | Admitting: *Deleted

## 2020-03-23 NOTE — Telephone Encounter (Signed)
Pt called requesting results of recent cxr. Attempted to call pt twice without success. VM left on both occassions for return call.

## 2020-03-29 ENCOUNTER — Encounter: Payer: Self-pay | Admitting: Surgery

## 2020-03-29 ENCOUNTER — Other Ambulatory Visit: Payer: Self-pay

## 2020-03-29 ENCOUNTER — Ambulatory Visit (INDEPENDENT_AMBULATORY_CARE_PROVIDER_SITE_OTHER): Payer: Self-pay | Admitting: Surgery

## 2020-03-29 VITALS — BP 118/75 | HR 92 | Temp 97.6°F | Resp 20 | Ht 67.0 in | Wt 145.0 lb

## 2020-03-29 DIAGNOSIS — J869 Pyothorax without fistula: Secondary | ICD-10-CM

## 2020-03-29 DIAGNOSIS — Z9889 Other specified postprocedural states: Secondary | ICD-10-CM

## 2020-03-29 NOTE — Progress Notes (Signed)
    HPI: Patient returns for routine postoperative follow-up having undergone left thoracotomy with drainage of empyema and decortication of the left lung on 02/04/2020. The patient's early postoperative recovery while in the hospital was notable for a slow postoperative course due to preoperative debilitation due to his empyema. Since hospital discharge the patient reports that he has continued to improve.  He still has some left-sided chest pain radiating down the midline of his chest and into the upper abdomen.  He denies any cough or sputum production.  He denies any fever or chills.  He has abstained from smoking since surgery.   Current Outpatient Medications  Medication Sig Dispense Refill  . albuterol (VENTOLIN HFA) 108 (90 Base) MCG/ACT inhaler Inhale 2 puffs into the lungs every 6 (six) hours as needed for wheezing or shortness of breath. 8 g 2   No current facility-administered medications for this visit.    Physical Exam: BP 118/75   Pulse 92   Temp 97.6 F (36.4 C) (Skin)   Resp 20   Ht 5\' 7"  (1.702 m)   Wt 145 lb (65.8 kg)   SpO2 96% Comment: RA  BMI 22.71 kg/m  He looks well. Cardiac exam shows a regular rate and rhythm and normal heart sounds. His lungs are clear. Left chest incisions are well-healed.  Diagnostic Tests:  CLINICAL DATA:  Follow-up empyema with shortness of breath, history of LEFT VATS and RIGHT pneumothorax by report.  EXAM: CHEST - 2 VIEW  COMPARISON:  February 25, 2020  FINDINGS: Trachea is midline. Cardiomediastinal contours are stable. LEFT hemidiaphragm remains elevated with signs of basilar pleural scarring. Consolidative changes and density along the LEFT chest wall and in the LEFT lung base have diminished, still with some LEFT lower lobe opacity in this area.  RIGHT chest is clear.  No acute skeletal process on limited assessment.  IMPRESSION: Decreased pleural based density and parenchymal opacity in the LEFT chest,  overall improvement since prior imaging. Difficult to exclude the possibility of residual infection.   Electronically Signed   By: February 27, 2020 M.D.   On: 03/22/2020 15:17  Impression:  Overall I think he has made a good recovery following his surgery.  The left lung opacity has almost completely resolved and there is no residual pleural effusion.  I would expect him to continue having some left chest discomfort for at least 3 months following surgery and this usually gradually improves.  He is not quite 2 months out from surgery and asked him not to do any heavy lifting for 3 months postoperatively.  He works in a 05/20/2020 and I think he could return to work without restriction after 3 months.  Plan:  He will return to see me as needed if he develops any problems with his incisions.  I do not think he requires any further chest x-ray.   [employer], MD [facility] [phone]

## 2021-05-09 IMAGING — DX DG CHEST 1V PORT
1 series · 1 of 1 positions shown · non-contrast
Comparison: Portable exam 7576 hours compared to 9513 hours

CLINICAL DATA: LEFT pleural effusion post thoracentesis; aspirated
fluid grossly purulent and foul-smelling consistent with empyema

EXAM:
PORTABLE CHEST 1 VIEW

[chest ap]
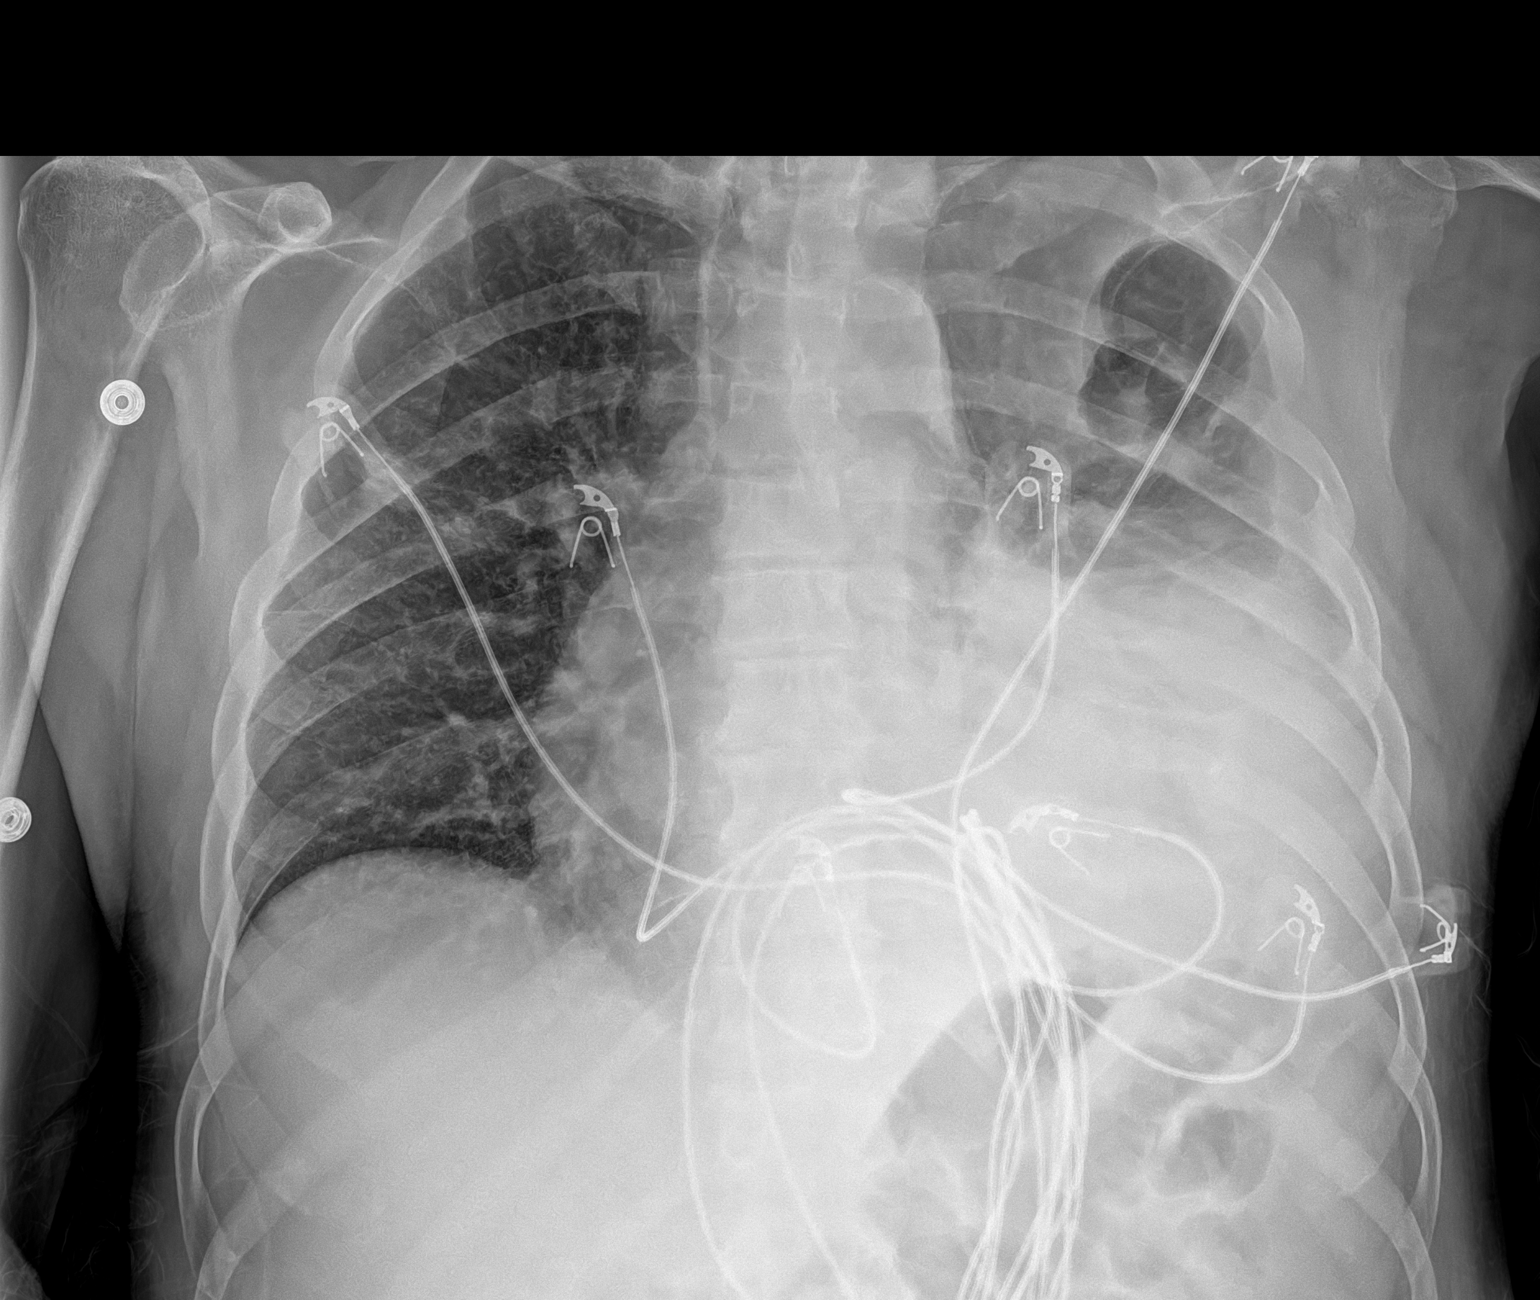

[1 of 1 positions shown; findings below may reference images not displayed]

FINDINGS: Upper normal heart size.

Complicated partially loculated LEFT pleural fluid collection with
significant atelectasis and consolidation.

Appearance unchanged from earlier study.

No pneumothorax.

Atelectasis versus infiltrate or scarring in the RIGHT upper lobe
unchanged.
IMPRESSION: No pneumothorax following LEFT thoracentesis.

Persistent complicated LEFT pleural effusion with atelectasis and/or
consolidation in LEFT lung.

## 2021-05-09 IMAGING — DX DG CHEST 1V PORT
1 series · 1 of 1 positions shown · non-contrast
Comparison: January 06, 2020

CLINICAL DATA: Shortness of breath

EXAM:
PORTABLE CHEST 1 VIEW

[chest ap]
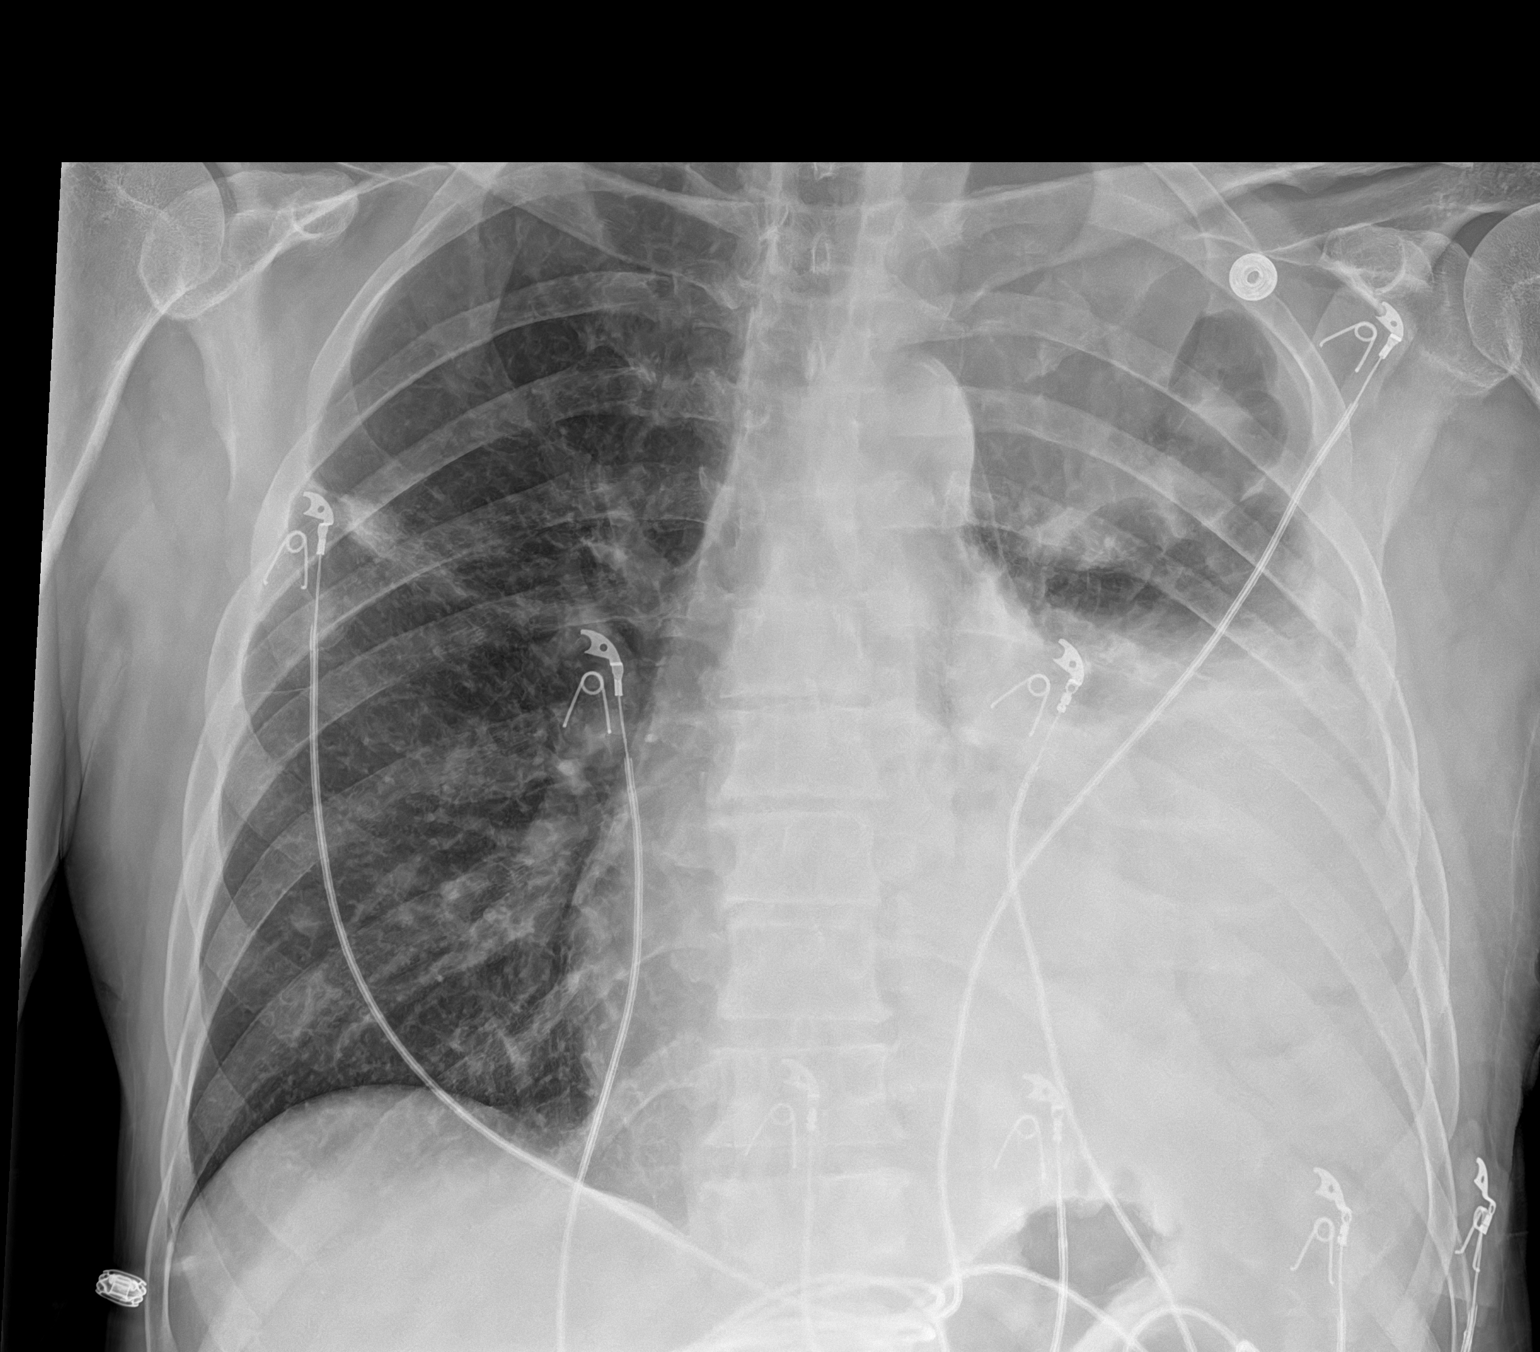

[1 of 1 positions shown; findings below may reference images not displayed]

FINDINGS: There is a moderate pleural effusion on the left. There is extensive
airspace opacity throughout much of the left lung consistent with
extensive progression of pneumonia since 1 month prior. There is
ill-defined opacity in the right upper lobe, less prominent than on
previous study.

Heart size and pulmonary vascularity are normal. No adenopathy. No
bone lesions.
IMPRESSION: Widespread airspace opacity throughout the left lung consistent with
extensive progression of pneumonia. Pleural effusion also noted on
the left, moderate in size. Less infiltrate right upper lobe
compared to previous study. Right lung otherwise clear. Grossly
stable cardiac silhouette.

## 2021-05-09 IMAGING — US US THORACENTESIS ASP PLEURAL SPACE W/IMG GUIDE
1 series · 5 of 5 positions shown · non-contrast
Comparison: none

INDICATION: Recent pneumonia, still not feeling well despite treatment

[Series 1: us thoracentesis asp pleural space w/img guide · 5 of 5 slices shown]
[im 1/5]
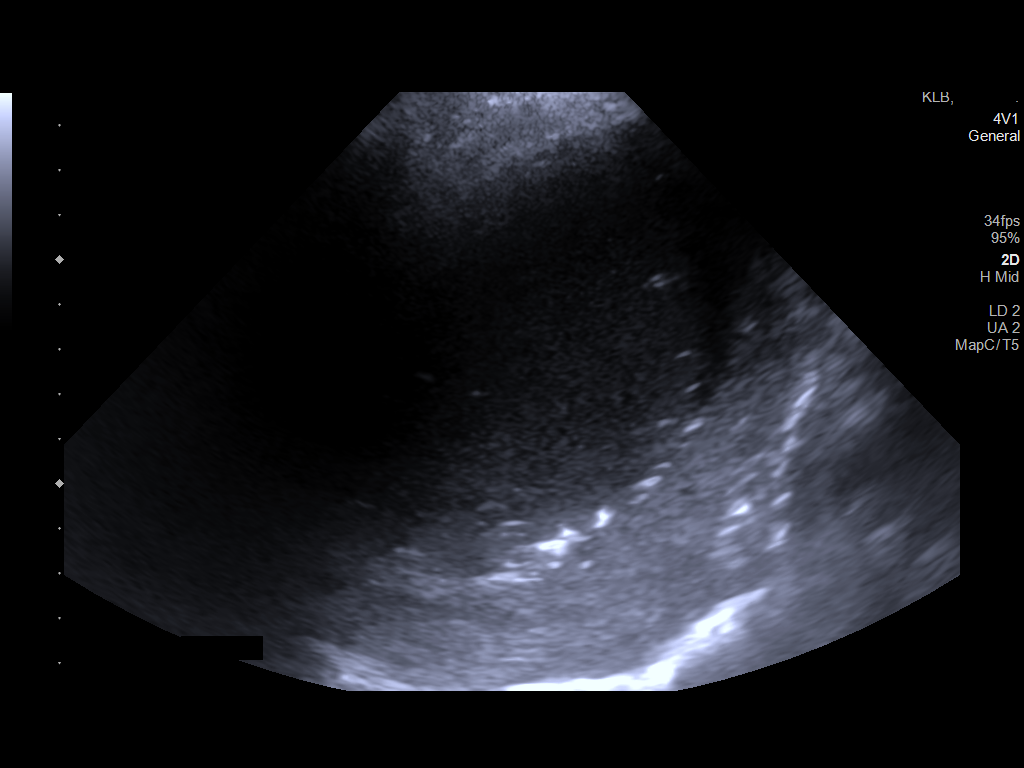
[im 2/5]
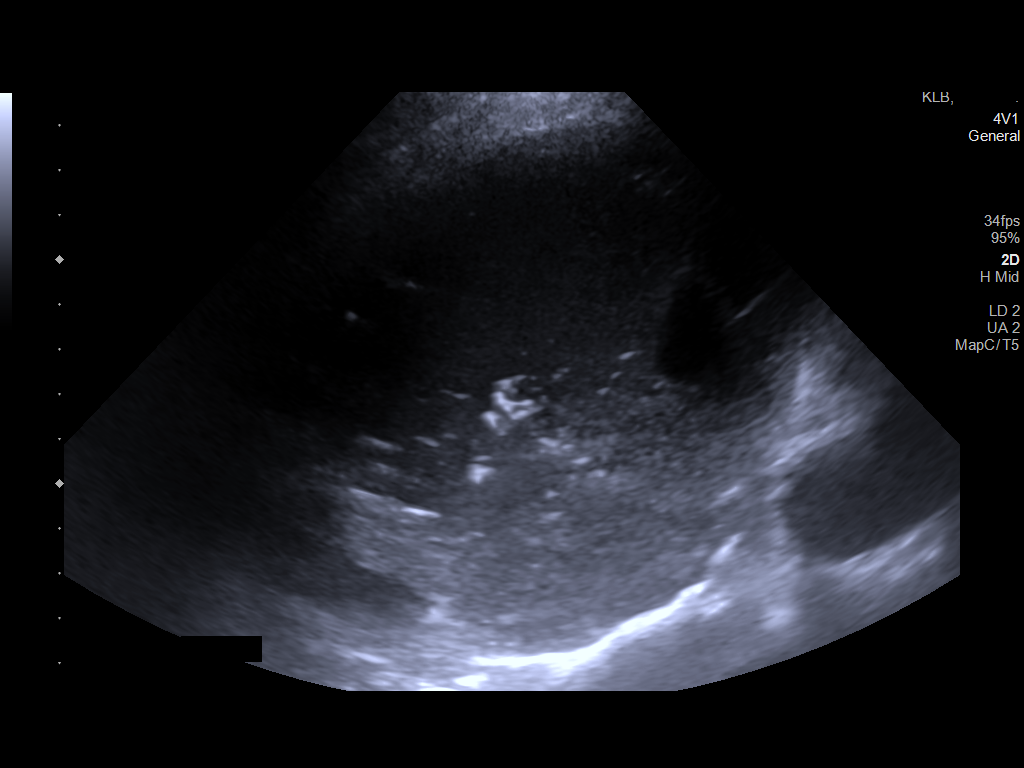
[im 3/5]
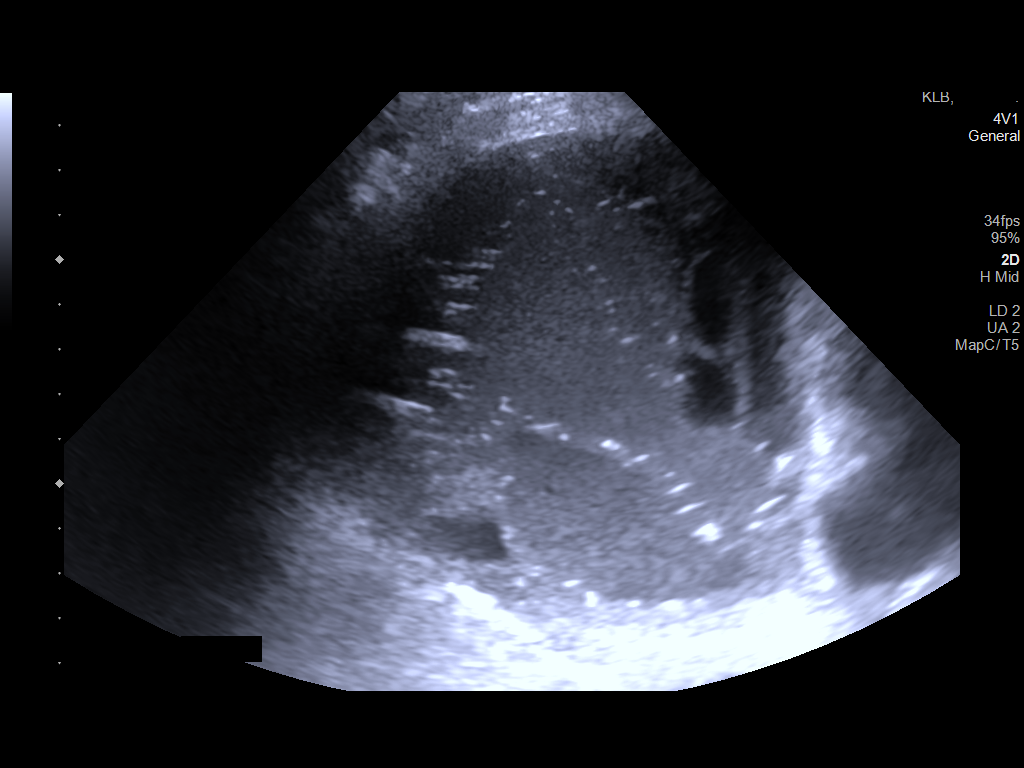
[im 4/5]
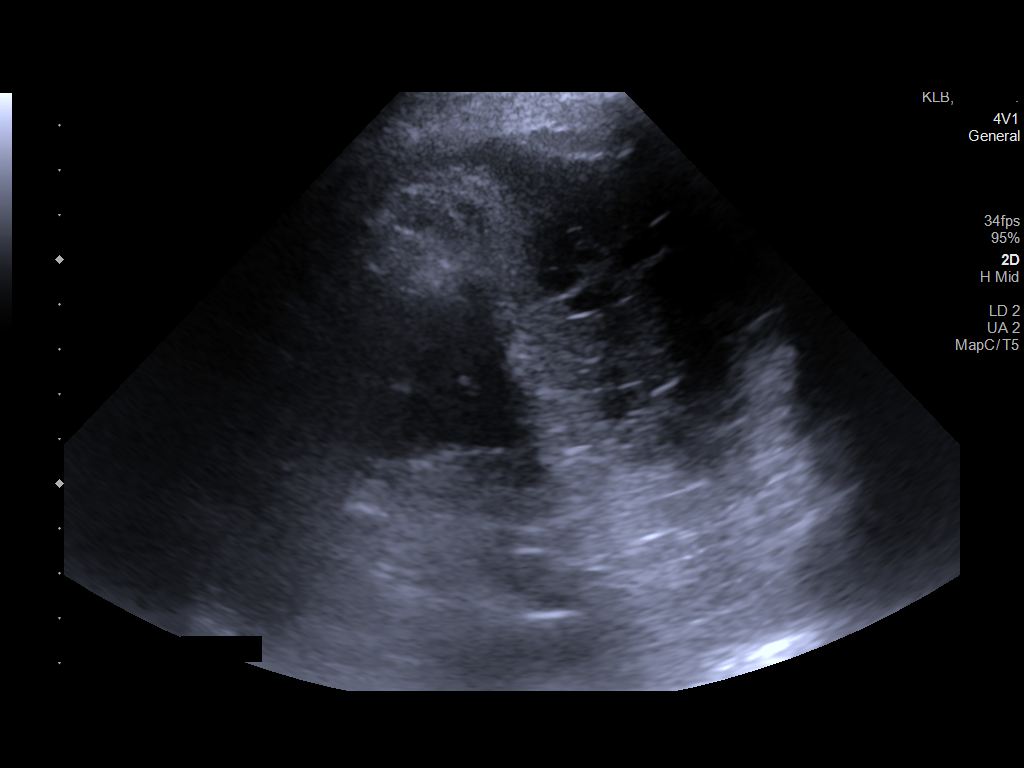
[im 5/5]
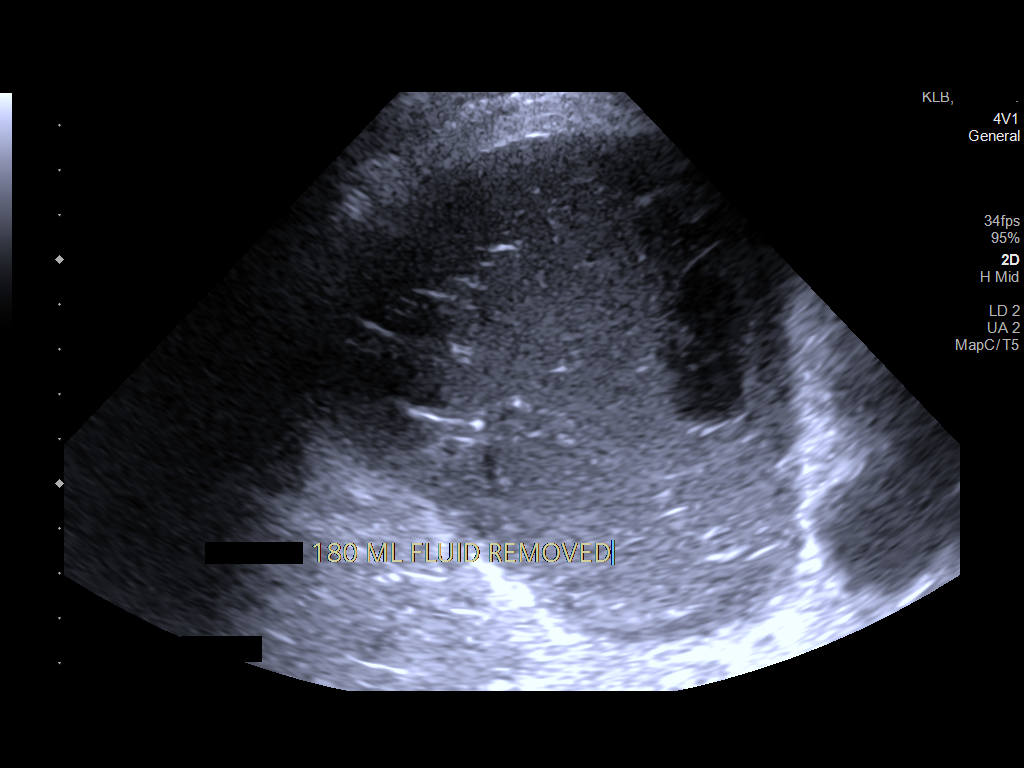

[5 of 5 positions shown; findings below may reference images not displayed]

EXAM:
ULTRASOUND GUIDED DIAGNOSTIC LEFT THORACENTESIS

MEDICATIONS:
None.

COMPLICATIONS:
None immediate.

PROCEDURE:
An ultrasound guided thoracentesis was thoroughly discussed with the
patient and questions answered. The benefits, risks, alternatives
and complications were also discussed. The patient understands and
wishes to proceed with the procedure. Written consent was obtained.

Ultrasound was performed to localize and mark an adequate pocket of
fluid in the LEFT chest. Observed LEFT pleural fluid collection is
markedly complex, containing scattered echogenic foci question air,
complex heterogeneous fluid, and markedly loculated in appearance.
The area was then prepped and draped in the normal sterile fashion.
1% Lidocaine was used for local anesthesia. Under ultrasound
guidance a 8 French thoracentesis catheter was introduced.
Thoracentesis was performed. The catheter was removed and a dressing
applied.
FINDINGS: A total of approximately 180 mL of grossly purulent and foul
smelling fluid was removed. Samples were sent to the laboratory as
requested by the clinical team.
IMPRESSION: Successful ultrasound guided diagnostic LEFT thoracentesis yielding
180 mL of pleural fluid as above.

Sonographic findings in appearance of the fluid are consistent with
LEFT empyema.

## 2021-05-11 IMAGING — DX DG CHEST 1V PORT
1 series · 1 of 1 positions shown · non-contrast
Comparison: Chest radiograph dated 02/02/2020 and CT dated
02/02/2020

CLINICAL DATA: 59-year-old male status post thoracotomy.

EXAM:
PORTABLE CHEST 1 VIEW

[chest]
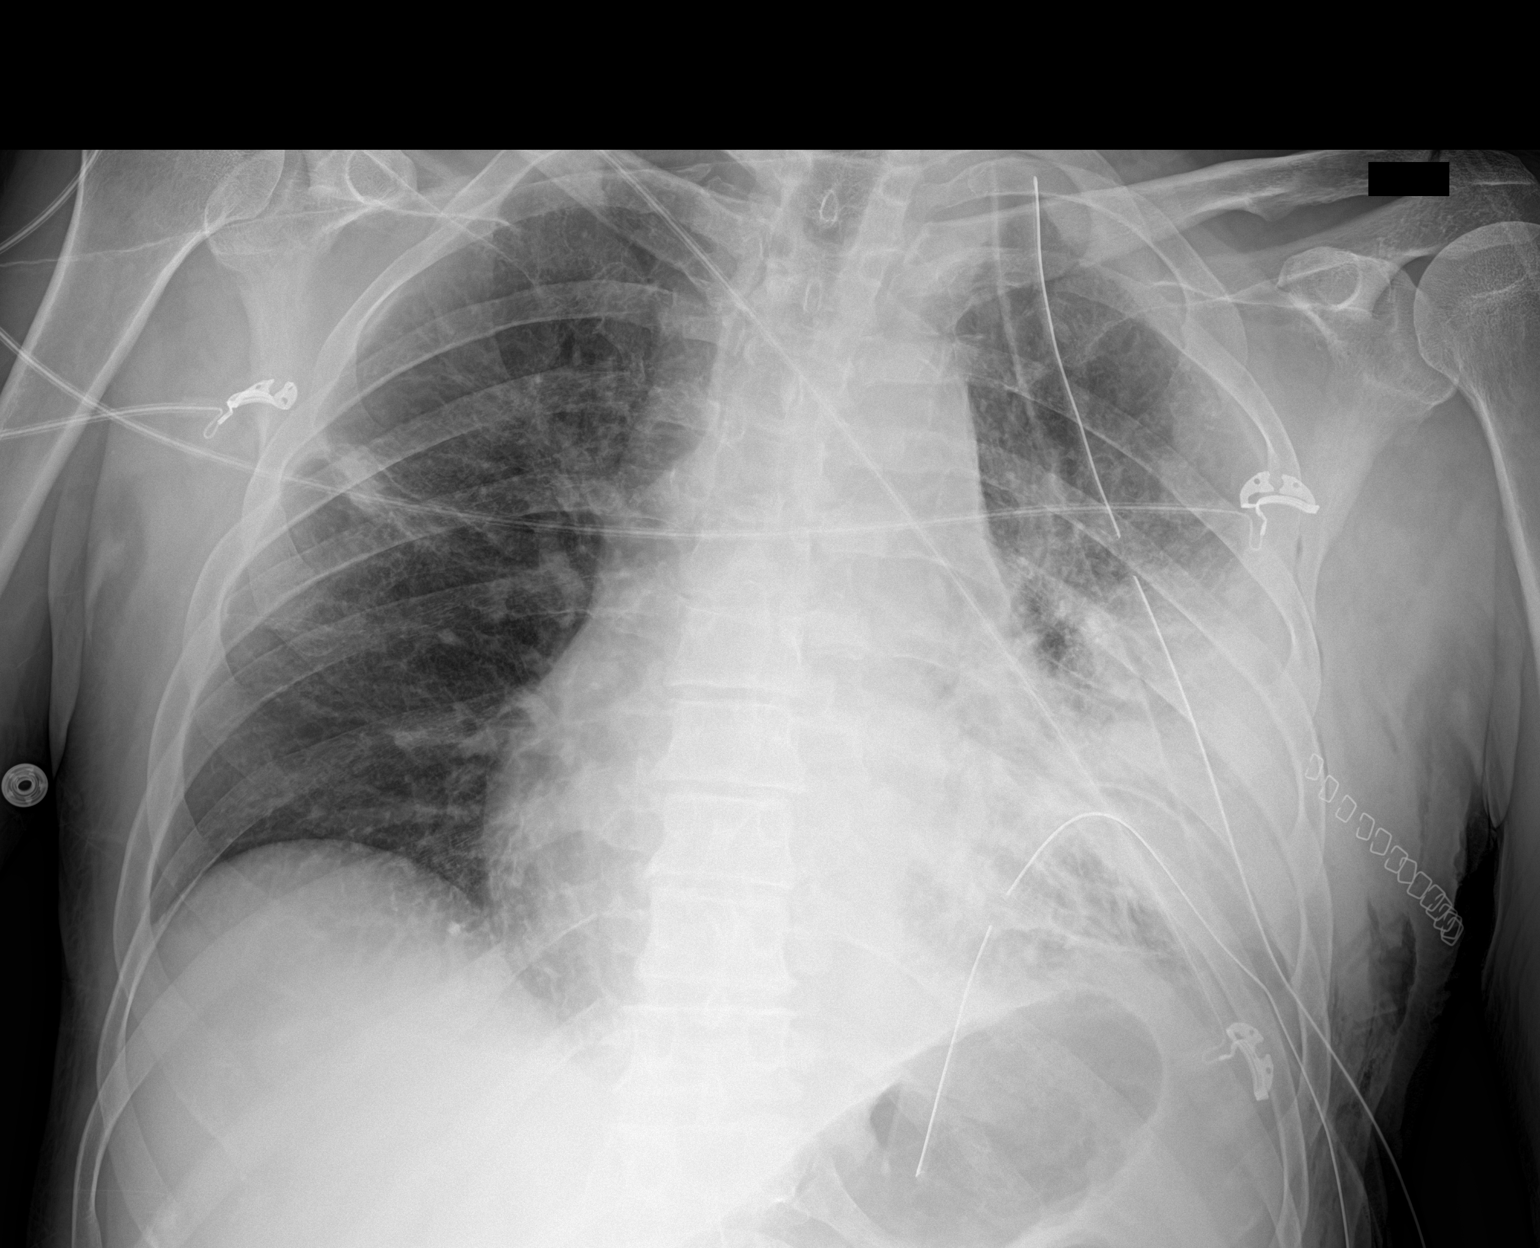

[1 of 1 positions shown; findings below may reference images not displayed]

FINDINGS: Left-sided chest tubes with tips in the left apex and left lung
base. Patchy area of consolidation involving the left mid and lower
lung field may represent atelectasis or postsurgical changes. A
small left pleural effusion may be present. No pneumothorax. A 2 cm
focal subpleural nodule in the right mid lung field. Stable cardiac
silhouette. No acute osseous pathology. Left chest wall soft tissue
air and cutaneous clips, postsurgical.
IMPRESSION: 1. Left-sided chest tubes. No pneumothorax.
2. Patchy area of consolidation involving the left mid and lower
lung field may represent atelectasis or postsurgical changes.

## 2021-05-14 IMAGING — DX DG CHEST 1V PORT
1 series · 1 of 1 positions shown · non-contrast
Comparison: February 06, 2020.

CLINICAL DATA: Chest tube.  Empyema.

EXAM:
PORTABLE CHEST 1 VIEW

[chest ap]
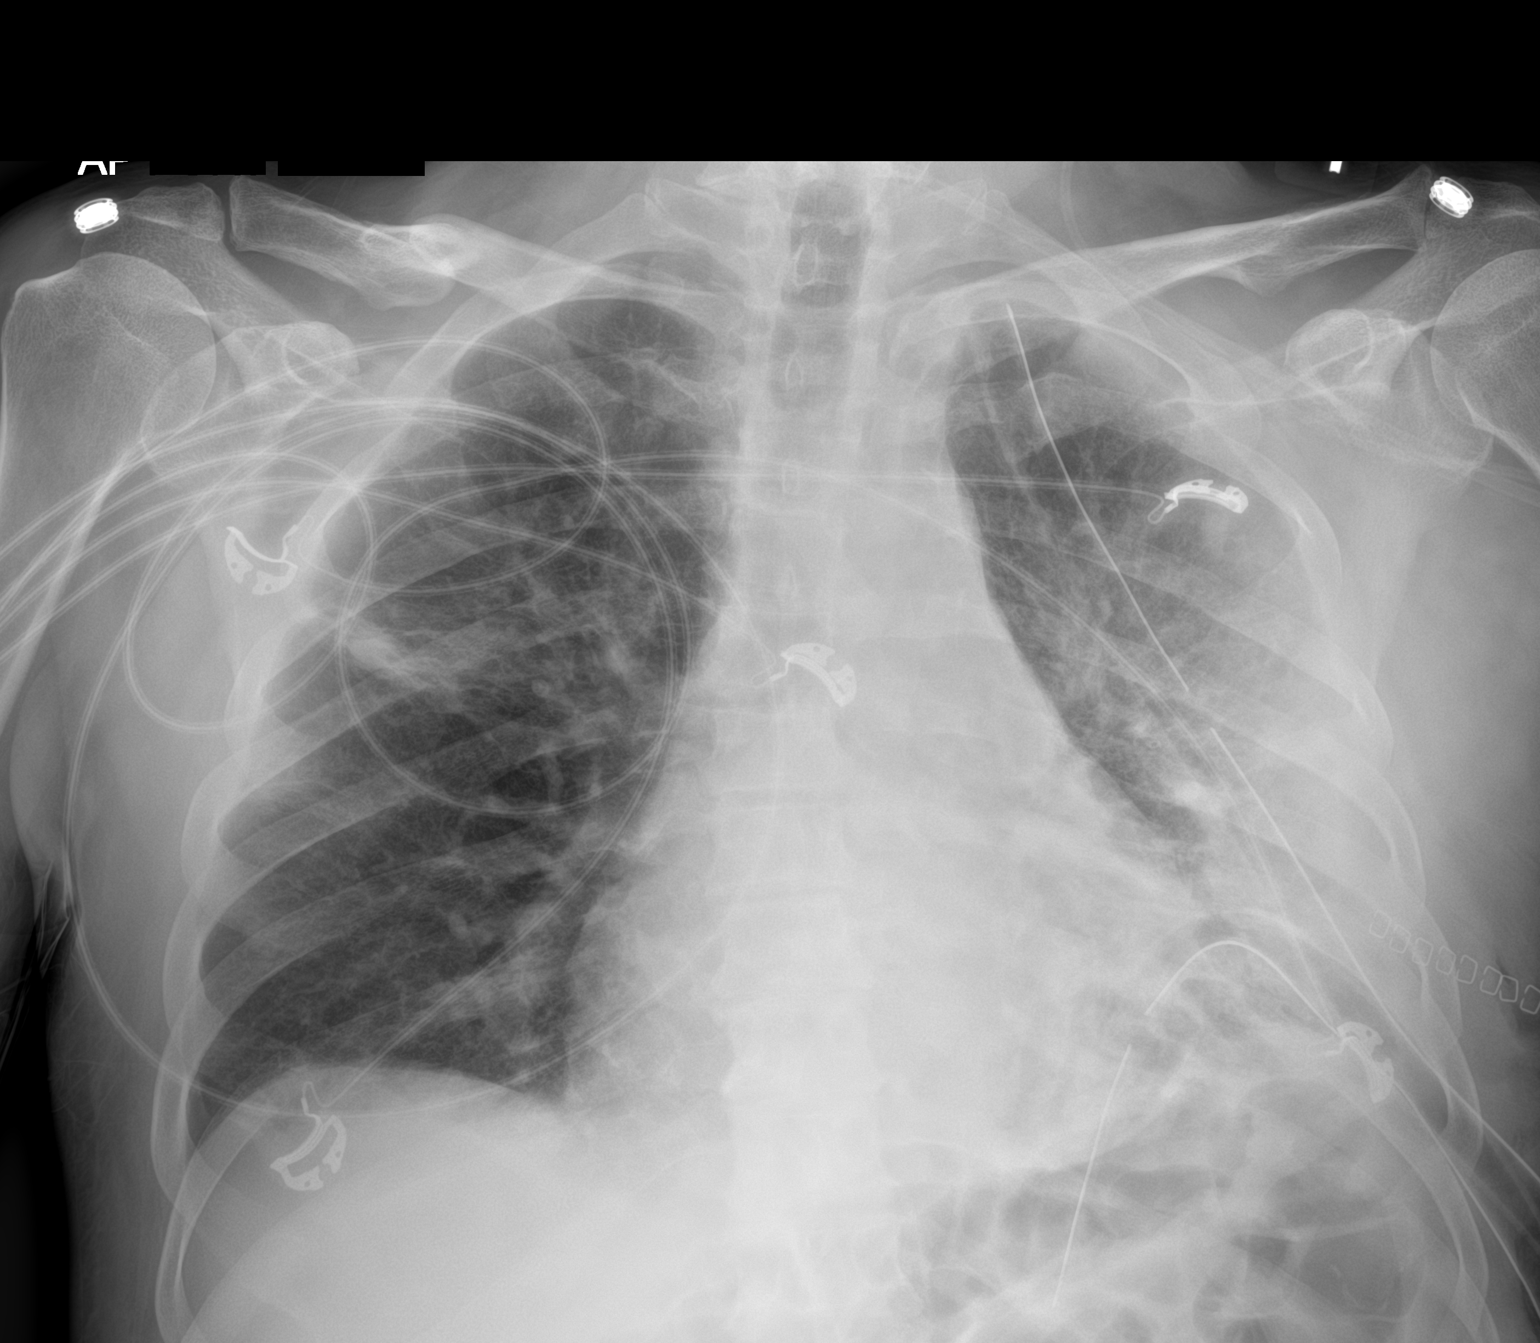

[1 of 1 positions shown; findings below may reference images not displayed]

FINDINGS: Stable cardiomediastinal silhouette. Two left-sided chest tubes are
unchanged without pneumothorax. Stable bilateral lung opacities are
noted concerning for pneumonia, left greater than right. Bony thorax
is unremarkable.
IMPRESSION: Stable bilateral lung opacities are noted concerning for pneumonia,
left greater than right. Two left-sided chest tubes are unchanged
without pneumothorax.
